# Patient Record
Sex: Female | Born: 1970 | Race: White | Hispanic: No | Marital: Married | Smoking: Never smoker
Health system: Southern US, Community
[De-identification: ages and names within clinical notes are randomized; demographics above are authoritative.]

## PROBLEM LIST (undated history)

## (undated) DIAGNOSIS — D649 Anemia, unspecified: Secondary | ICD-10-CM

## (undated) DIAGNOSIS — N289 Disorder of kidney and ureter, unspecified: Secondary | ICD-10-CM

## (undated) DIAGNOSIS — K219 Gastro-esophageal reflux disease without esophagitis: Secondary | ICD-10-CM

## (undated) DIAGNOSIS — J45909 Unspecified asthma, uncomplicated: Secondary | ICD-10-CM

## (undated) DIAGNOSIS — F419 Anxiety disorder, unspecified: Secondary | ICD-10-CM

## (undated) DIAGNOSIS — C541 Malignant neoplasm of endometrium: Secondary | ICD-10-CM

## (undated) DIAGNOSIS — J189 Pneumonia, unspecified organism: Secondary | ICD-10-CM

## (undated) DIAGNOSIS — Z87442 Personal history of urinary calculi: Secondary | ICD-10-CM

## (undated) HISTORY — PX: COLONOSCOPY: SHX174

## (undated) HISTORY — PX: CHOLECYSTECTOMY: SHX55

---

## 2005-09-14 ENCOUNTER — Emergency Department: Payer: Self-pay | Admitting: General Practice

## 2007-01-03 ENCOUNTER — Ambulatory Visit: Payer: Self-pay | Admitting: Internal Medicine

## 2009-10-14 ENCOUNTER — Ambulatory Visit: Payer: Self-pay | Admitting: Internal Medicine

## 2010-01-23 ENCOUNTER — Emergency Department: Payer: Self-pay | Admitting: Emergency Medicine

## 2010-07-29 ENCOUNTER — Ambulatory Visit: Payer: Self-pay | Admitting: Internal Medicine

## 2010-08-10 ENCOUNTER — Ambulatory Visit: Payer: Self-pay | Admitting: Family Medicine

## 2011-01-05 ENCOUNTER — Ambulatory Visit: Payer: Self-pay | Admitting: Family Medicine

## 2011-04-02 ENCOUNTER — Ambulatory Visit: Payer: Self-pay | Admitting: Internal Medicine

## 2012-10-17 ENCOUNTER — Emergency Department: Payer: Self-pay | Admitting: Emergency Medicine

## 2013-05-14 ENCOUNTER — Ambulatory Visit: Payer: Self-pay | Admitting: Family Medicine

## 2013-05-16 ENCOUNTER — Ambulatory Visit: Payer: Self-pay | Admitting: Emergency Medicine

## 2013-11-20 ENCOUNTER — Ambulatory Visit: Payer: Self-pay | Admitting: Internal Medicine

## 2013-11-20 LAB — URINALYSIS, COMPLETE
Bilirubin,UR: NEGATIVE
GLUCOSE, UR: NEGATIVE
KETONE: NEGATIVE
LEUKOCYTE ESTERASE: NEGATIVE
Nitrite: NEGATIVE
Ph: 6 (ref 5.0–8.0)
Specific Gravity: >=1.03 (ref 1.000–1.030)

## 2013-11-20 LAB — CBC WITH DIFFERENTIAL/PLATELET
BASOS ABS: 0 10*3/uL (ref 0.0–0.1)
Basophil %: 0.7 %
EOS ABS: 0.1 10*3/uL (ref 0.0–0.7)
Eosinophil %: 2.4 %
HCT: 41 % (ref 35.0–47.0)
HGB: 13.5 g/dL (ref 12.0–16.0)
LYMPHS PCT: 21.6 %
Lymphocyte #: 1.1 10*3/uL (ref 1.0–3.6)
MCH: 31.9 pg (ref 26.0–34.0)
MCHC: 32.9 g/dL (ref 32.0–36.0)
MCV: 97 fL (ref 80–100)
Monocyte #: 0.5 x10 3/mm (ref 0.2–0.9)
Monocyte %: 10.1 %
Neutrophil #: 3.4 10*3/uL (ref 1.4–6.5)
Neutrophil %: 65.2 %
Platelet: 194 10*3/uL (ref 150–440)
RBC: 4.23 10*6/uL (ref 3.80–5.20)
RDW: 13.2 % (ref 11.5–14.5)
WBC: 5.3 10*3/uL (ref 3.6–11.0)

## 2013-11-20 LAB — COMPREHENSIVE METABOLIC PANEL
ANION GAP: 7 (ref 7–16)
AST: 22 U/L (ref 15–37)
Albumin: 3.7 g/dL (ref 3.4–5.0)
Alkaline Phosphatase: 92 U/L
BILIRUBIN TOTAL: 0.5 mg/dL (ref 0.2–1.0)
BUN: 11 mg/dL (ref 7–18)
CALCIUM: 9.2 mg/dL (ref 8.5–10.1)
CO2: 31 mmol/L (ref 21–32)
Chloride: 102 mmol/L (ref 98–107)
Creatinine: 0.78 mg/dL (ref 0.60–1.30)
EGFR (African American): >60
GLUCOSE: 94 mg/dL (ref 65–99)
OSMOLALITY: 279 (ref 275–301)
Potassium: 3.9 mmol/L (ref 3.5–5.1)
SGPT (ALT): 24 U/L
Sodium: 140 mmol/L (ref 136–145)
Total Protein: 7.7 g/dL (ref 6.4–8.2)

## 2013-11-20 LAB — AMYLASE: Amylase: 38 U/L (ref 25–115)

## 2013-11-20 LAB — WET PREP, GENITAL

## 2013-11-20 LAB — LIPASE, BLOOD: LIPASE: 105 U/L (ref 73–393)

## 2013-11-20 LAB — GC/CHLAMYDIA PROBE AMP

## 2013-11-22 LAB — URINE CULTURE

## 2014-06-10 ENCOUNTER — Ambulatory Visit: Payer: Self-pay | Admitting: Emergency Medicine

## 2014-06-14 ENCOUNTER — Ambulatory Visit: Payer: Self-pay | Admitting: Registered Nurse

## 2014-07-02 ENCOUNTER — Ambulatory Visit: Payer: Self-pay | Admitting: Family Medicine

## 2014-07-23 ENCOUNTER — Ambulatory Visit: Admit: 2014-07-23 | Disposition: A | Discharge status: Home / Self Care | Payer: Self-pay | Admitting: Family Medicine

## 2015-07-15 ENCOUNTER — Encounter: Payer: Self-pay | Admitting: *Deleted

## 2015-07-15 ENCOUNTER — Ambulatory Visit
Admission: EM | Admit: 2015-07-15 | Discharge: 2015-07-15 | Disposition: A | Discharge status: Home / Self Care | Payer: BLUE CROSS/BLUE SHIELD | Attending: Family Medicine | Admitting: Family Medicine

## 2015-07-15 DIAGNOSIS — N390 Urinary tract infection, site not specified: Secondary | ICD-10-CM | POA: Diagnosis not present

## 2015-07-15 HISTORY — DX: Disorder of kidney and ureter, unspecified: N28.9

## 2015-07-15 LAB — URINALYSIS COMPLETE WITH MICROSCOPIC (ARMC ONLY)
Bilirubin Urine: NEGATIVE
Glucose, UA: NEGATIVE mg/dL
Ketones, ur: NEGATIVE mg/dL
NITRITE: NEGATIVE
PROTEIN: NEGATIVE mg/dL
SPECIFIC GRAVITY, URINE: 1.01 (ref 1.005–1.030)
pH: 5.5 (ref 5.0–8.0)

## 2015-07-15 LAB — PREGNANCY, URINE: PREG TEST UR: NEGATIVE

## 2015-07-15 MED ORDER — NITROFURANTOIN MONOHYD MACRO 100 MG PO CAPS: 100.0000 mg | ORAL_CAPSULE | Freq: Two times a day (BID) | ORAL | Status: DC

## 2015-07-15 NOTE — ED Notes (Signed)
Patient started having symptoms of urinary frequency and back pain 2 days ago.

## 2015-07-15 NOTE — ED Provider Notes (Signed)
CSN: [id number]     Arrival date & time 07/15/15  0802 History   First MD Initiated Contact with Patient 07/15/15 [phone]     Chief Complaint  Patient presents with  . Urinary Tract Infection   (Consider location/radiation/quality/duration/timing/severity/associated sxs/prior Treatment) HPI: Patient presents today with symptoms of urinary frequency, suprapubic pain, bilateral flank pain. Patient states that she's had the symptoms for the last 2 days. She denies any fever, vomiting, diarrhea, vaginal discharge, vaginal bleeding. She states that her menstrual cycles are irregular and her last menstrual cycle was in January. ?renal disorder listed on PMHx. She states renal function is normal.   Past Medical History  Diagnosis Date  . Renal disorder    Past Surgical History  Procedure Laterality Date  . Cholecystectomy     History reviewed. No pertinent family history. Social History  Substance Use Topics  . Smoking status: Never Smoker   . Smokeless tobacco: Never Used  . Alcohol Use: No   OB History    No data available     Review of Systems: Negative except mentioned above.   Allergies  Keflex  Home Medications   Prior to Admission medications   Medication Sig Start Date End Date Taking? Authorizing Provider  nitrofurantoin, macrocrystal-monohydrate, (MACROBID) 100 MG capsule Take 1 capsule (100 mg total) by mouth 2 (two) times daily. 07/15/15   Paulina Fusi, MD   Meds Ordered and Administered this Visit  Medications - No data to display  BP 118/50 mmHg  Pulse 65  Resp 18  Ht 5\' 8"  (1.727 m)  Wt 300 lb (136.079 kg)  BMI 45.63 kg/m2  SpO2 97%  LMP 05/12/2015 No data found.   Physical Exam   GENERAL: NAD, obese HEENT: no pharyngeal erythema, no exudate RESP: CTA B CARD: RRR ABD: +BS, mild suprapubic tenderness, mild bilateral flank tenderness  NEURO: CN II-XII grossly intact   ED Course  Procedures (including critical care time)  Labs Review Labs Reviewed   URINALYSIS COMPLETEWITH MICROSCOPIC ([place]) - Abnormal; Notable for the following:    Color, Urine YELLOW (*)    APPearance HAZY (*)    Hgb urine dipstick 1+ (*)    Leukocytes, UA 3+ (*)    Bacteria, UA FEW (*)    Squamous Epithelial / LPF 6-30 (*)    All other components within normal limits  URINE CULTURE  PREGNANCY, URINE    Imaging Review No results found.   MDM   1. Urinary tract infection without hematuria, site unspecified   Urine analysis reviewed, urine sent for culture, urine pregnancy negative, will treat patient with Macrobid, seek medical attention if symptoms persist or worsen as discussed.    Paulina Fusi, MD 07/15/15 [phone]

## 2015-07-16 LAB — URINE CULTURE

## 2016-01-20 ENCOUNTER — Ambulatory Visit (INDEPENDENT_AMBULATORY_CARE_PROVIDER_SITE_OTHER)
Admission: EM | Admit: 2016-01-20 | Discharge: 2016-01-20 | Disposition: A | Discharge status: Home / Self Care | Payer: BLUE CROSS/BLUE SHIELD | Source: Home / Self Care | Attending: Family Medicine | Admitting: Family Medicine

## 2016-01-20 ENCOUNTER — Encounter: Payer: Self-pay | Admitting: Emergency Medicine

## 2016-01-20 ENCOUNTER — Emergency Department
Admission: EM | Admit: 2016-01-20 | Discharge: 2016-01-20 | Disposition: A | Discharge status: Home / Self Care | Payer: BLUE CROSS/BLUE SHIELD | Attending: Emergency Medicine | Admitting: Emergency Medicine

## 2016-01-20 ENCOUNTER — Emergency Department: Payer: BLUE CROSS/BLUE SHIELD

## 2016-01-20 DIAGNOSIS — R079 Chest pain, unspecified: Secondary | ICD-10-CM

## 2016-01-20 DIAGNOSIS — Z79899 Other long term (current) drug therapy: Secondary | ICD-10-CM | POA: Insufficient documentation

## 2016-01-20 LAB — CBC
HCT: 38.9 % (ref 35.0–47.0)
HEMOGLOBIN: 13.3 g/dL (ref 12.0–16.0)
MCH: 31.9 pg (ref 26.0–34.0)
MCHC: 34.3 g/dL (ref 32.0–36.0)
MCV: 93 fL (ref 80.0–100.0)
PLATELETS: 187 10*3/uL (ref 150–440)
RBC: 4.18 MIL/uL (ref 3.80–5.20)
RDW: 14.3 % (ref 11.5–14.5)
WBC: 6.2 10*3/uL (ref 3.6–11.0)

## 2016-01-20 LAB — BASIC METABOLIC PANEL
ANION GAP: 6 (ref 5–15)
BUN: 10 mg/dL (ref 6–20)
CHLORIDE: 105 mmol/L (ref 101–111)
CO2: 28 mmol/L (ref 22–32)
CREATININE: 0.83 mg/dL (ref 0.44–1.00)
Calcium: 9.1 mg/dL (ref 8.9–10.3)
GFR calc non Af Amer: >60 mL/min (ref >60)
Glucose, Bld: 103 mg/dL — ABNORMAL HIGH (ref 65–99)
Potassium: 3.7 mmol/L (ref 3.5–5.1)
Sodium: 139 mmol/L (ref 135–145)

## 2016-01-20 LAB — TROPONIN I

## 2016-01-20 MED ORDER — ASPIRIN 81 MG PO CHEW
324.0000 mg | CHEWABLE_TABLET | Freq: Once | ORAL | Status: AC
  Administered 2016-01-20: 324 mg via ORAL

## 2016-01-20 NOTE — ED Triage Notes (Addendum)
Patient c/o chest pain. She has a history acid reflux and takes zantac over the counter, but a family history of heart attacks on both sides of the family. She mentions the pain started last night when she was feeling really stressful at work. She says the pain in her chest and left shoulder it feels like she pulled a muscle in her shoulder.

## 2016-01-20 NOTE — ED Provider Notes (Signed)
[facility] ____________________________________________  Time seen: Approximately 2:36 PM  I have reviewed the triage vital signs and the nursing notes.   HISTORY  Chief Complaint Chest Pain   HPI Teresa Cameron is a 45 y.o. female presenting today for the complaints of left-sided chest pain. Patient with a history of obesity and gastric reflux, he reports that last night at around 2 or 3 AM while at work she began having left-sided chest pain. Patient reports this onset was during a verbal argument. Patient reports her work has been very stressful for the last several months. Patient reports left-sided chest pain has continued. Reports some intermittent radiation to the left posterior shoulder blade and left arm. Reports pain has been constant but does have fluctuating severity. States pain at this time is mild and described as just a pain to left chest.  Patient does report she has a history of gastric reflux issues with some previous similar symptoms, but states this is atypical due to the radiation to back and arm.  Denies fall or trauma. Denies strenuous activity. Denies being able to reproduce the pain. Denies accompanying dizziness, shortness of breath, vision changes, numbness or loss of sensation, weakness. Reports last ate approximately 9 AM. Denies smoking history. Patient denies any other chronic medical conditions. Reports extensive cardiac history on both mother and father side.    Past Medical History:  Diagnosis Date  . Renal disorder     There are no active problems to display for this patient.   Past Surgical History:  Procedure Laterality Date  . CHOLECYSTECTOMY      Current Outpatient Rx  . Order #: [id number] Class: Historical Med    No current facility-administered medications for this encounter.   Current Outpatient Prescriptions:  .  ranitidine (ZANTAC) 150 MG tablet, Take 150 mg by mouth 2 (two) times daily., Disp: , Rfl:    Allergies Keflex [cephalexin]  Family History  Problem Relation Age of Onset  . Heart failure Mother   . Heart failure Father   Grandfather MI (deceased in 99s) Uncle MI DM  Social History Social History  Substance Use Topics  . Smoking status: Never Smoker  . Smokeless tobacco: Never Used  . Alcohol use No    Review of Systems Constitutional: No fever/chills Eyes: No visual changes. ENT: No sore throat. Cardiovascular: Positive chest pain. Respiratory: Denies shortness of breath. Gastrointestinal: No abdominal pain.  No nausea, no vomiting.  No diarrhea.  No constipation. Genitourinary: Negative for dysuria. Musculoskeletal: Negative for back pain. Skin: Negative for rash. Neurological: Negative for headaches, focal weakness or numbness.  10-point ROS otherwise negative.  ____________________________________________   PHYSICAL EXAM:  VITAL SIGNS: ED Triage Vitals  Enc Vitals Group     BP 01/20/16 1346 (!) 155/82     Pulse Rate 01/20/16 1346 82     Resp 01/20/16 1346 18     Temp 01/20/16 1346 97.9 F (36.6 C)     Temp Source 01/20/16 1346 Oral     SpO2 01/20/16 1346 97 %     Weight 01/20/16 1346 300 lb (136.1 kg)     Height 01/20/16 1346 5\' 8"  (1.727 m)     Head Circumference --      Peak Flow --      Pain Score 01/20/16 1347 3     Pain Loc --      Pain Edu? --      Excl. in [place]? --     Constitutional: Alert and oriented.  Well appearing and in no acute distress. Eyes: Conjunctivae are normal. PERRL. EOMI. ENT      Head: Normocephalic and atraumatic.      Nose: No congestion/rhinnorhea.      Mouth/Throat: Mucous membranes are moist.Oropharynx non-erythematous. Cardiovascular: Normal rate, regular rhythm. Grossly normal heart sounds.  Good peripheral circulation. Respiratory: Normal respiratory effort without tachypnea nor retractions. Breath sounds are clear and equal bilaterally. No wheezes/rales/rhonchi.. Gastrointestinal: Soft and nontender. Obese  abdomen. Musculoskeletal:  Nontender with normal range of motion in all extremities. No midline cervical, thoracic or lumbar tenderness to palpation. Bilateral pedal pulses equal and easily palpated. left anterior chest minimal diffuse tenderness to direct palpation, no pain with left arm range of motion or movement. Neurologic:  Normal speech and language. No gross focal neurologic deficits are appreciated. Speech is normal. No gait instability.  Skin:  Skin is warm, dry and intact. No rash noted. Psychiatric: Mood and affect are normal. Speech and behavior are normal. Patient exhibits appropriate insight and judgment   ___________________________________________   LABS (all labs ordered are listed, but only abnormal results are displayed)  Labs Reviewed - No data to display ____________________________________________  EKG  ED ECG REPORT I, Marylene Land, the attending provider and Dr. Zenda Alpers, personally viewed and interpreted this ECG.   Date: 01/20/2016  EKG Time: 1346   Rate: 73  Rhythm: sinus rhythm with marked sinus arrhythmia  Axis:normal  Intervals:none  ST&T Change: nonspecific T wave abnormality.    PROCEDURES Procedures     INITIAL IMPRESSION / ASSESSMENT AND PLAN / ED COURSE  Pertinent labs & imaging results that were available during my care of the patient were reviewed by me and considered in my medical decision making (see chart for details).  Overall well-appearing patient. No acute distress. Presents for 12 hour complaint of left-sided chest pain. Reports current chest pain present and described as mild. Discussed in detail with patient concern for cardiac etiology. Discussed pain is somewhat reproducible to left anterior chest and with some previous description of similar sensations with acid reflux, however discussed possibility of cardiac etiology. Patient with obesity and family history of cardiac issues. Recommend for patient to be seen in emergency room  at this time for further evaluation. Patient alert and oriented with decisional capacity, and states that her brother will take her directly to the ER, declines EMS transfer. 324 mg chewable aspirin given in urgent care. Marya Amsler RN charge at [facility] called and report given. Patient stable the time of transfer to ER.  Discussed follow up with Primary care physician this week. Discussed follow up and return parameters including no resolution or any worsening concerns. Patient verbalized understanding and agreed to plan.   ____________________________________________   FINAL CLINICAL IMPRESSION(S) / ED DIAGNOSES  Final diagnoses:  Chest pain, unspecified type     Discharge Medication List as of 01/20/2016  2:17 PM      Note: This dictation was prepared with Dragon dictation along with smaller phrase technology. Any transcriptional errors that result from this process are unintentional.    Clinical Course      Marylene Land, NP 01/20/16 1458    Marylene Land, NP 01/20/16 1459

## 2016-01-20 NOTE — Discharge Instructions (Signed)
Go directly to Emergency room as discussed.  °

## 2016-01-20 NOTE — ED Notes (Signed)
Pt c/o left sided chest pain that started last night while she was at work.  Pt reports pain radiates into left shoulder/arm.  Pt denies sob, denies dizziness, denies back pain.

## 2016-01-20 NOTE — ED Triage Notes (Signed)
Pt to ed with c/o left sided chest pain that started last night while she was at work.  Pt reports pain radiates into left shoulder, left arm area.  Pt denies sob, denies dizziness, denies back pain, reports "I feel stressed"

## 2016-01-20 NOTE — ED Provider Notes (Signed)
[facility] Emergency Department Provider Note  ____________________________________________   First MD Initiated Contact with Patient 01/20/16 1632     (approximate)  I have reviewed the triage vital signs and the nursing notes.   HISTORY  Chief Complaint Chest Pain   HPI Teresa Cameron is a 45 y.o. female with a history of asthma and gastroesophageal reflux was presenting with chest pain that started at 2 AM last night. She says that she was in a confrontation at work as a Presenter, broadcasting and she began having 2-3 out of 10 left-sided chest pain which radiated to her left shoulder. She denies any shortness of breath, nausea vomiting or diaphoresis with this. She says that she has had similar pain in the past in stressful situations but says that it has not lasted this long. She says that she took a Zantac last night which helped the pain for several hours. However, she feels the pain has been coming and going and lasts for 1-2 hours at a time. She is a family history of heart disease with her grandfather dying in his 88s from heart disease. She denies smoking or drug use. Describes the pain as a tingling and burning that rises up through her chest and radiates leftward. She denies any pain at this time.  Patient was seen in urgent care earlier today and then sent to the emergency department for further evaluation.   Past Medical History:  Diagnosis Date  . Renal disorder     There are no active problems to display for this patient.   Past Surgical History:  Procedure Laterality Date  . CHOLECYSTECTOMY      Prior to Admission medications   Medication Sig Start Date End Date Taking? Authorizing Provider  dicyclomine (BENTYL) 10 MG capsule Take 10 mg by mouth 4 (four) times daily -  before meals and at bedtime.   Yes Historical Provider, MD  ranitidine (ZANTAC) 150 MG tablet Take 150 mg by mouth 2 (two) times daily.   Yes Historical Provider, MD     Allergies Keflex [cephalexin]  Family History  Problem Relation Age of Onset  . Heart failure Mother   . Heart failure Father     Social History Social History  Substance Use Topics  . Smoking status: Never Smoker  . Smokeless tobacco: Never Used  . Alcohol use No    Review of Systems Constitutional: No fever/chills Eyes: No visual changes. ENT: No sore throat. Cardiovascular: as above Respiratory: Denies shortness of breath. Gastrointestinal: No abdominal pain.  No nausea, no vomiting.  No diarrhea.  No constipation. Genitourinary: Negative for dysuria. Musculoskeletal: Negative for back pain. Skin: Negative for rash. Neurological: Negative for headaches, focal weakness or numbness.  10-point ROS otherwise negative.  ____________________________________________   PHYSICAL EXAM:  VITAL SIGNS: ED Triage Vitals [01/20/16 1448]  Enc Vitals Group     BP      Pulse      Resp      Temp      Temp src      SpO2      Weight 300 lb (136.1 kg)     Height      Head Circumference      Peak Flow      Pain Score 3     Pain Loc      Pain Edu?      Excl. in [place]?     Constitutional: Alert and oriented. Well appearing and in no acute distress.  Eyes: Conjunctivae are normal. PERRL. EOMI. Head: Atraumatic. Nose: No congestion/rhinnorhea. Mouth/Throat: Mucous membranes are moist.  Neck: No stridor.   Cardiovascular: Normal rate, regular rhythm. Grossly normal heart sounds.  Chest pain is not reproducible to palpation. Respiratory: Normal respiratory effort.  No retractions. Lungs CTAB. Gastrointestinal: Soft and nontender. No distention. Musculoskeletal: No lower extremity tenderness nor edema.  No joint effusions. Neurologic:  Normal speech and language. No gross focal neurologic deficits are appreciated Skin:  Skin is warm, dry and intact. No rash noted. Psychiatric: Mood and affect are normal. Speech and behavior are  normal.  ____________________________________________   LABS (all labs ordered are listed, but only abnormal results are displayed)  Labs Reviewed  BASIC METABOLIC PANEL - Abnormal; Notable for the following:       Result Value   Glucose, Bld 103 (*)    All other components within normal limits  CBC  TROPONIN I   ____________________________________________  EKG  ED ECG REPORT I, Doran Stabler, the attending physician, personally viewed and interpreted this ECG.   Date: 01/20/2016  EKG Time: 1453  Rate: 66  Rhythm: normal sinus rhythm with sinus arrhythmia.  Axis: Normal  Intervals:none  ST&T Change: No ST elevation or depression. T-wave inversions in 3, aVF as well as V3 through V6. No previous for comparison's.  ____________________________________________  RADIOLOGY  DG Chest 2 View (Accession [id number]) (Order [id number])  Imaging  Date: 01/20/2016 Department: [place] Released By: Dan Europe, RN (auto-released) Authorizing: Orbie Pyo, MD  Exam Information   Status Exam Begun  Exam Ended   Final [99] 01/20/2016 3:06 PM 01/20/2016 3:07 PM  PACS Images   Show images for DG Chest 2 View  Study Result   CLINICAL DATA:  Left-sided chest pain  EXAM: CHEST  2 VIEW  COMPARISON:  Chest CT July 02, 2014; chest radiograph June 14, 2014  FINDINGS: There is no edema or consolidation. Heart size and pulmonary vascularity are within normal limits. No adenopathy. No pneumothorax. There is a prominent osteophyte in the posterior mid thoracic region, stable from prior studies.  IMPRESSION: No edema or consolidation. Stable prominent osteophyte posteriorly in the mid thoracic region.   Electronically Signed   By: Lowella Grip III M.D.   On: 01/20/2016 15:11     ____________________________________________   PROCEDURES  Procedure(s) performed:   Procedures  Critical Care  performed:   ____________________________________________   INITIAL IMPRESSION / ASSESSMENT AND PLAN / ED COURSE  Pertinent labs & imaging results that were available during my care of the patient were reviewed by me and considered in my medical decision making (see chart for details).  Patient symptom-free at this time. I discussed with her the changes on her EKG as well as offered further workup including another troponin in the emergency department. However, she is a heart score of 2 and she says that she would rather be seen as an outpatient. She is symptom-free at this time and has had multiple episodes of 1-2 hours of symptoms and still has an undetectable troponin level. She may have these T-wave changes from LVH and not ischemic disease. I discussed the need for her to urgently follow up with cardiology either tomorrow or Friday. She'll be given the office number for the cardiologist on-call and nose to return to the emergency room immediately for any worsening or concerning symptoms. She is understanding of this plan and willing to comply. While this episode is slightly different than  previous episodes the patient has had with stress she does say there are a lot of commonality's. It is quite possible this could be a combination of stress and reflux causing the symptoms and not cardiac disease. Normal heart rate. No shortness of breath. Unlikely to be pulmonary embolus.  Clinical Course     ____________________________________________   FINAL CLINICAL IMPRESSION(S) / ED DIAGNOSES  Chest pain.    NEW MEDICATIONS STARTED DURING THIS VISIT:  New Prescriptions   No medications on file     Note:  This document was prepared using Dragon voice recognition software and may include unintentional dictation errors.    Orbie Pyo, MD 01/20/16 [phone]

## 2016-10-12 ENCOUNTER — Ambulatory Visit
Admission: EM | Admit: 2016-10-12 | Discharge: 2016-10-12 | Disposition: A | Discharge status: Home / Self Care | Payer: BLUE CROSS/BLUE SHIELD | Attending: Family Medicine | Admitting: Family Medicine

## 2016-10-12 DIAGNOSIS — J069 Acute upper respiratory infection, unspecified: Secondary | ICD-10-CM

## 2016-10-12 HISTORY — DX: Unspecified asthma, uncomplicated: J45.909

## 2016-10-12 MED ORDER — BENZONATATE 200 MG PO CAPS: 200.0000 mg | ORAL_CAPSULE | Freq: Three times a day (TID) | ORAL | 0 refills | Status: DC

## 2016-10-12 MED ORDER — HYDROCOD POLST-CPM POLST ER 10-8 MG/5ML PO SUER: 5.0000 mL | Freq: Two times a day (BID) | ORAL | 0 refills | Status: DC

## 2016-10-12 MED ORDER — FEXOFENADINE-PSEUDOEPHED ER 60-120 MG PO TB12: 1.0000 | ORAL_TABLET | Freq: Two times a day (BID) | ORAL | 0 refills | Status: DC

## 2016-10-12 MED ORDER — FLUTICASONE PROPIONATE 50 MCG/ACT NA SUSP: 2.0000 | Freq: Every day | NASAL | 0 refills | Status: DC

## 2016-10-12 NOTE — ED Provider Notes (Signed)
CSN: [id number]     Arrival date & time 10/12/16  0805 History   First MD Initiated Contact with Patient 10/12/16 0825     Chief Complaint  Patient presents with  . Cough   (Consider location/radiation/quality/duration/timing/severity/associated sxs/prior Treatment) HPI 46 year old female who presents with cough and sinus pressure body aches nausea started yesterday. He states that the family member has had a diagnosis of bronchitis recently. Her cough is nonproductive. She has not had any fever or chills. States that her ears are itchy and her throat is slightly scratchy. He has been using only Tylenol for relief of her symptoms.        Past Medical History:  Diagnosis Date  . Asthma   . Renal disorder    Past Surgical History:  Procedure Laterality Date  . CHOLECYSTECTOMY     Family History  Problem Relation Age of Onset  . Heart failure Mother   . Heart failure Father    Social History  Substance Use Topics  . Smoking status: Never Smoker  . Smokeless tobacco: Never Used  . Alcohol use No   OB History    No data available     Review of Systems  Constitutional: Negative for chills, fatigue and fever.  HENT: Positive for congestion, ear pain, postnasal drip, sinus pain, sinus pressure and sore throat.   Respiratory: Positive for cough.   All other systems reviewed and are negative.   Allergies  Keflex [cephalexin]  Home Medications   Prior to Admission medications   Medication Sig Start Date End Date Taking? Authorizing Provider  benzonatate (TESSALON) 200 MG capsule Take 1 capsule (200 mg total) by mouth 3 (three) times daily. 10/12/16   Lorin Picket, PA-C  chlorpheniramine-HYDROcodone (TUSSIONEX PENNKINETIC ER) 10-8 MG/5ML SUER Take 5 mLs by mouth 2 (two) times daily. 10/12/16   Lorin Picket, PA-C  fexofenadine-pseudoephedrine (ALLEGRA-D) 60-120 MG 12 hr tablet Take 1 tablet by mouth every 12 (twelve) hours. 10/12/16   Lorin Picket, PA-C   fluticasone (FLONASE) 50 MCG/ACT nasal spray Place 2 sprays into both nostrils daily. 10/12/16   Lorin Picket, PA-C   Meds Ordered and Administered this Visit  Medications - No data to display  BP 137/69 (BP Location: Left Arm)   Pulse 72   Temp 98.2 F (36.8 C) (Oral)   Resp 18   Ht 5\' 8"  (1.727 m)   Wt (!) 330 lb (149.7 kg)   SpO2 97%   BMI 50.18 kg/m  No data found.   Physical Exam  Constitutional: She is oriented to person, place, and time. She appears well-developed and well-nourished. No distress.  HENT:  Head: Normocephalic and atraumatic.  Right Ear: External ear normal.  Left Ear: External ear normal.  Nose: Nose normal.  Mouth/Throat: Oropharynx is clear and moist. No oropharyngeal exudate.  Eyes: EOM are normal. Pupils are equal, round, and reactive to light. Right eye exhibits no discharge. Left eye exhibits no discharge.  Neck: Normal range of motion.  Pulmonary/Chest: Effort normal and breath sounds normal. No respiratory distress. She has no wheezes. She has no rales.  Musculoskeletal: Normal range of motion.  Lymphadenopathy:    She has no cervical adenopathy.  Neurological: She is alert and oriented to person, place, and time.  Skin: Skin is warm and dry. She is not diaphoretic.  Psychiatric: She has a normal mood and affect. Her behavior is normal. Judgment and thought content normal.  Nursing note and vitals reviewed.   Urgent  Care Course     Procedures (including critical care time)  Labs Review Labs Reviewed - No data to display  Imaging Review No results found.   Visual Acuity Review  Right Eye Distance:   Left Eye Distance:   Bilateral Distance:    Right Eye Near:   Left Eye Near:    Bilateral Near:         MDM   1. Upper respiratory tract infection, unspecified type    Discharge Medication List as of 10/12/2016  8:40 AM    START taking these medications   Details  benzonatate (TESSALON) 200 MG capsule Take 1 capsule  (200 mg total) by mouth 3 (three) times daily., Starting Wed 10/12/2016, Normal    chlorpheniramine-HYDROcodone (TUSSIONEX PENNKINETIC ER) 10-8 MG/5ML SUER Take 5 mLs by mouth 2 (two) times daily., Starting Wed 10/12/2016, Print    fexofenadine-pseudoephedrine (ALLEGRA-D) 60-120 MG 12 hr tablet Take 1 tablet by mouth every 12 (twelve) hours., Starting Wed 10/12/2016, Normal    fluticasone (FLONASE) 50 MCG/ACT nasal spray Place 2 sprays into both nostrils daily., Starting Wed 10/12/2016, Normal      Plan: 1. Test/x-ray results and diagnosis reviewed with patient 2. rx as per orders; risks, benefits, potential side effects reviewed with patient 3. Recommend supportive treatment with Medical treatment of the cough and body aches. I recommended that she consider using ibuprofen along with the Tylenol. I told her this is a viral illness and will need to run its course. If however she continues to have her symptoms or even worsens and that tend to 14 days she will return to the clinic for additional evaluation. 4. F/u prn if symptoms worsen or don't improve     Lorin Picket, PA-C 10/12/16 [id number]

## 2016-10-12 NOTE — ED Triage Notes (Addendum)
Pt c/o cough, sinus pressure, body aches, nausea and not feeling well since yesterday. She mentions that someone in her home has bronchitis and she feels that she may have it also.

## 2017-04-10 IMAGING — CR DG LUMBAR SPINE 2-3V
1 series · 4 of 4 positions shown · non-contrast
Comparison: None.

CLINICAL DATA: Chronic lumbago/low back pain. Back pain for several
years. RIGHT leg pain and numbness with tingling.

EXAM:
LUMBAR SPINE - 2-3 VIEW

[Series 1: ap · 0.17mm/px · 4 of 4 slices shown]
[im 1/4]
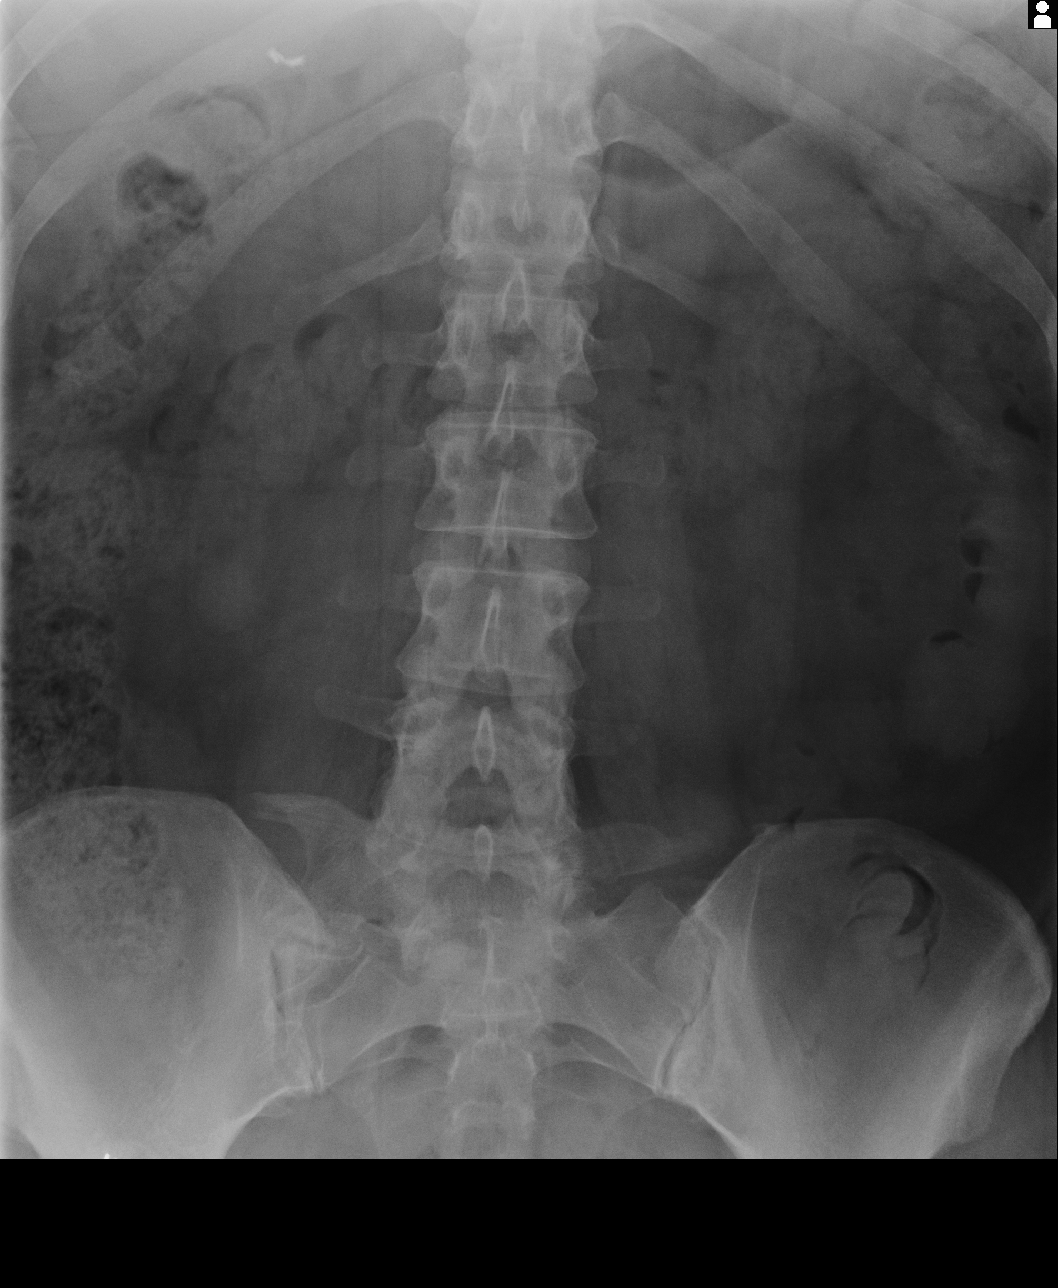
[im 2/4]
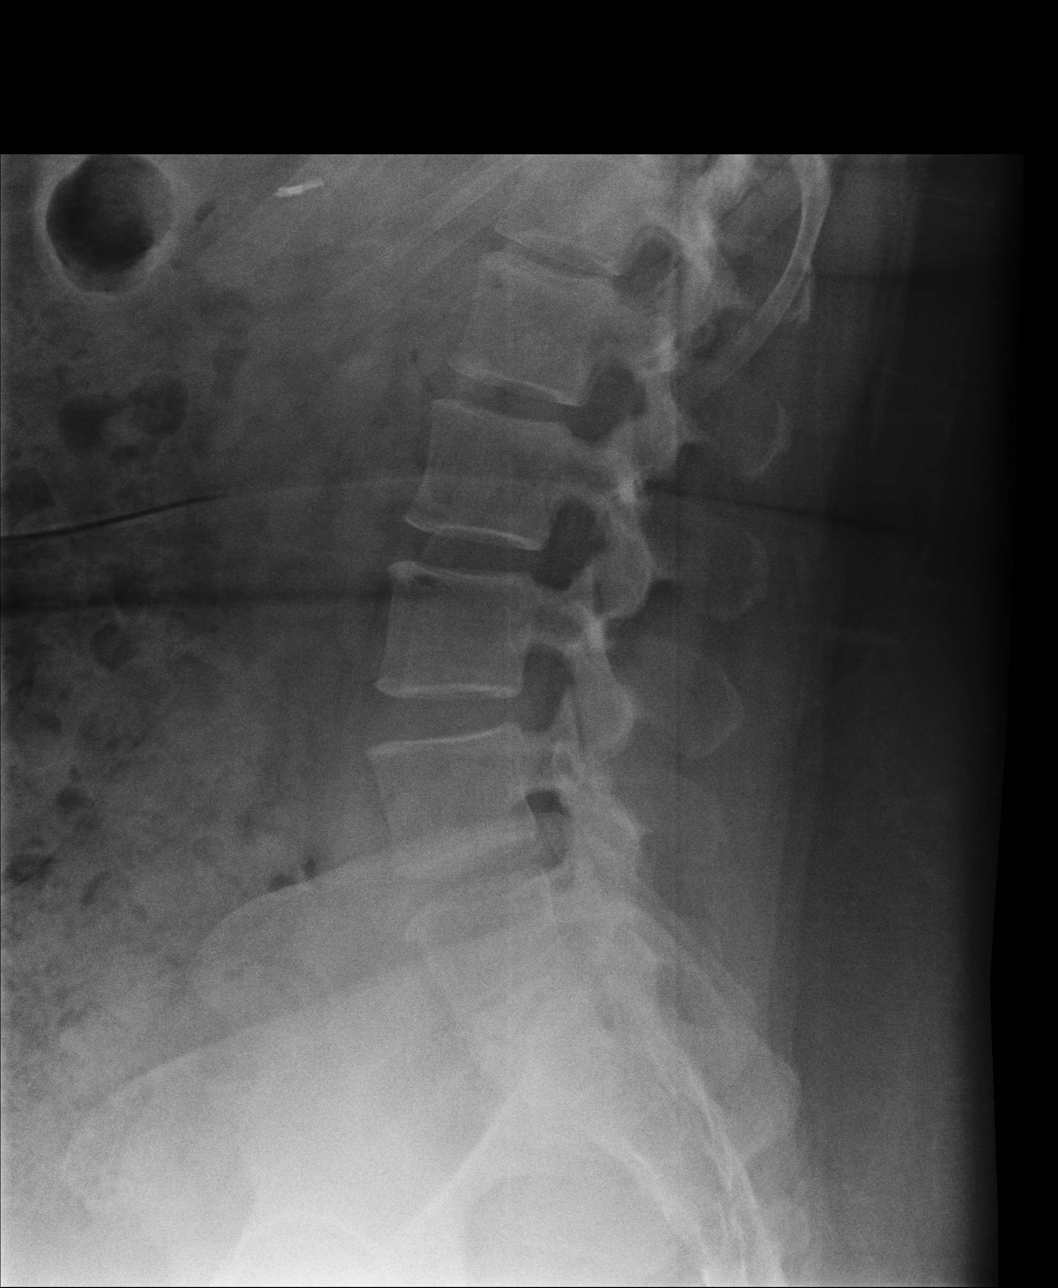
[im 3/4]
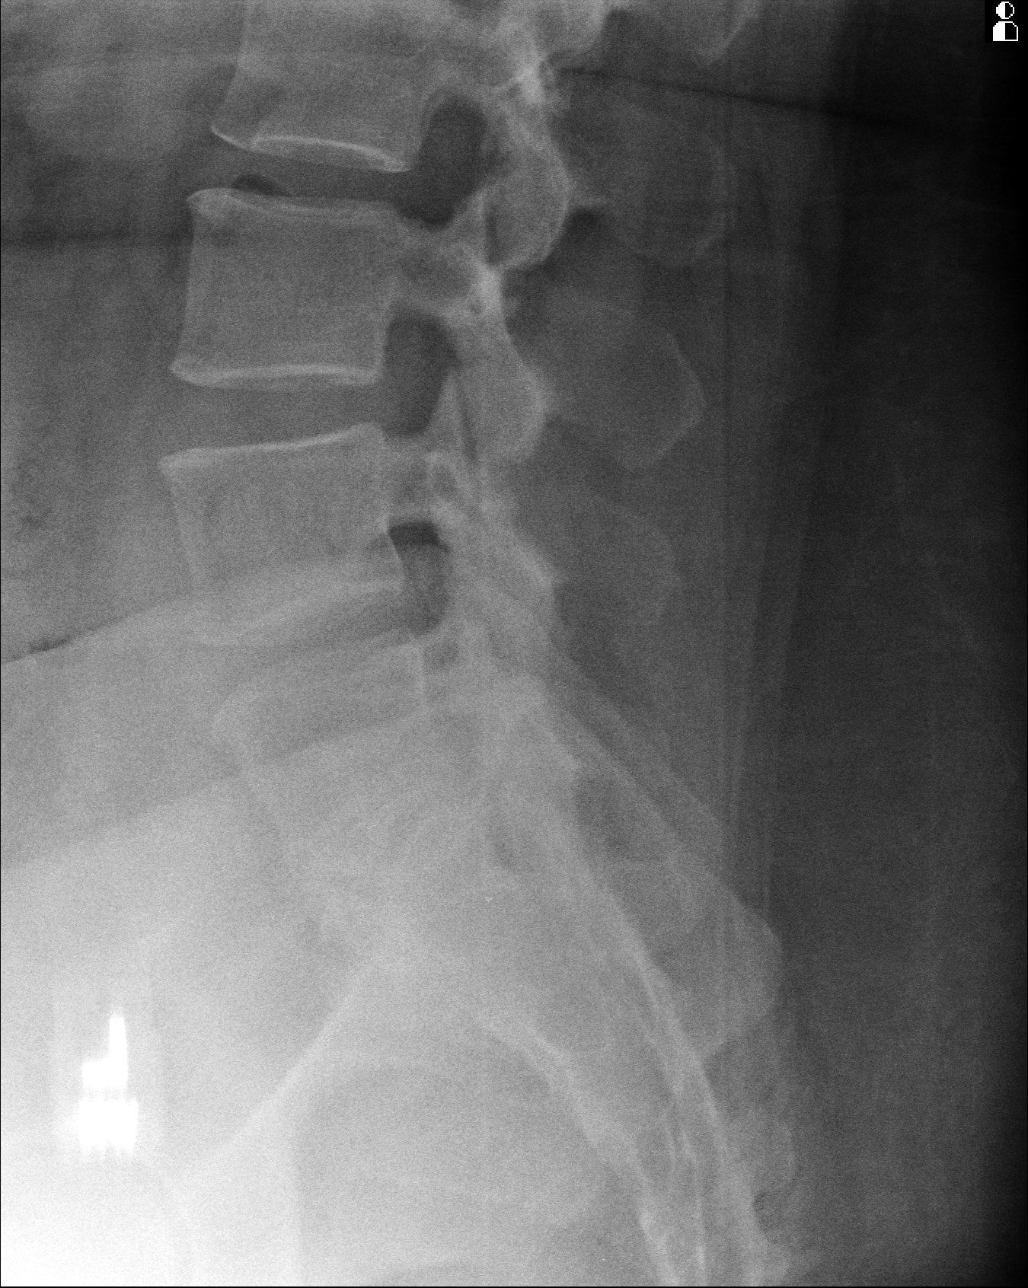
[im 4/4]
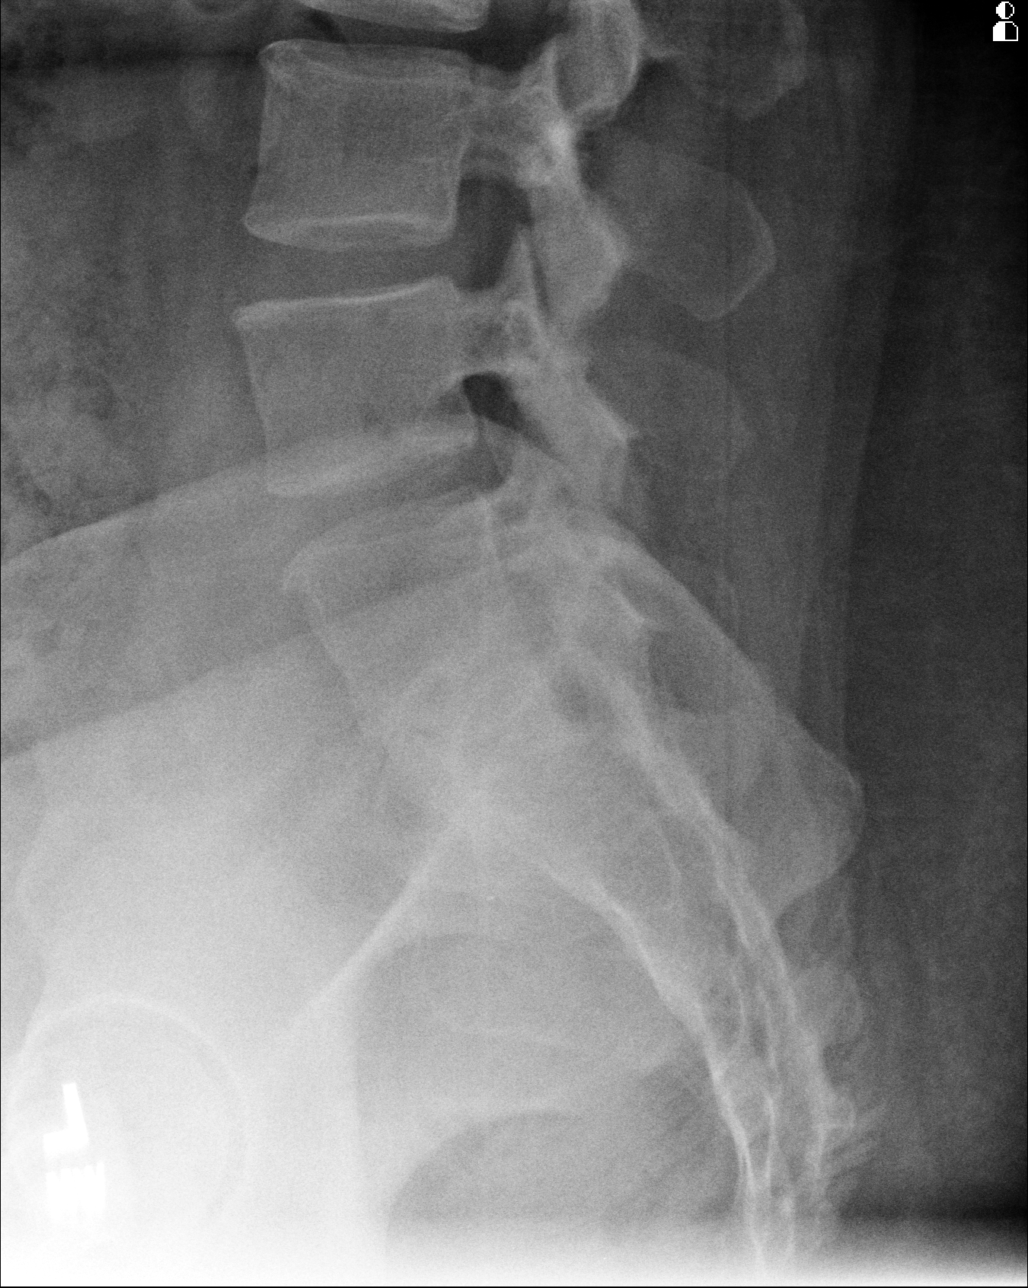

[4 of 4 positions shown; findings below may reference images not displayed]

FINDINGS: Five lumbar type vertebral bodies are present with sacralization of
the RIGHT L5 transverse process. Rudimentary L5-S1 disc noted on the
lateral view. The RIGHT L5 transverse process articulates with the
sacrum. Vertebral body height is preserved. Intervertebral disc
spaces appear within normal limits. Lumbosacral transitional anatomy
with sacralization of the RIGHT L5 transverse process.
IMPRESSION: No acute osseous abnormality.

## 2019-04-04 ENCOUNTER — Ambulatory Visit
Admission: EM | Admit: 2019-04-04 | Discharge: 2019-04-04 | Disposition: A | Discharge status: Home / Self Care | Payer: Managed Care, Other (non HMO) | Attending: Family Medicine | Admitting: Family Medicine

## 2019-04-04 ENCOUNTER — Other Ambulatory Visit: Payer: Self-pay

## 2019-04-04 DIAGNOSIS — M791 Myalgia, unspecified site: Secondary | ICD-10-CM | POA: Diagnosis not present

## 2019-04-04 DIAGNOSIS — Z20828 Contact with and (suspected) exposure to other viral communicable diseases: Secondary | ICD-10-CM | POA: Insufficient documentation

## 2019-04-04 DIAGNOSIS — R509 Fever, unspecified: Secondary | ICD-10-CM | POA: Diagnosis not present

## 2019-04-04 DIAGNOSIS — R110 Nausea: Secondary | ICD-10-CM | POA: Diagnosis not present

## 2019-04-04 DIAGNOSIS — Z881 Allergy status to other antibiotic agents status: Secondary | ICD-10-CM | POA: Insufficient documentation

## 2019-04-04 DIAGNOSIS — R109 Unspecified abdominal pain: Secondary | ICD-10-CM | POA: Diagnosis not present

## 2019-04-04 DIAGNOSIS — Z79899 Other long term (current) drug therapy: Secondary | ICD-10-CM | POA: Diagnosis not present

## 2019-04-04 LAB — URINALYSIS, COMPLETE (UACMP) WITH MICROSCOPIC
Bacteria, UA: NONE SEEN
Bilirubin Urine: NEGATIVE
Glucose, UA: NEGATIVE mg/dL
Ketones, ur: NEGATIVE mg/dL
Leukocytes,Ua: NEGATIVE
Nitrite: NEGATIVE
Protein, ur: NEGATIVE mg/dL
Specific Gravity, Urine: 1.015 (ref 1.005–1.030)
pH: 6 (ref 5.0–8.0)

## 2019-04-04 LAB — SARS CORONAVIRUS 2 AG (30 MIN TAT): SARS Coronavirus 2 Ag: NEGATIVE

## 2019-04-04 MED ORDER — ONDANSETRON HCL 4 MG PO TABS: 4.0000 mg | ORAL_TABLET | Freq: Three times a day (TID) | ORAL | 0 refills | Status: DC | PRN

## 2019-04-04 NOTE — Discharge Instructions (Signed)
Rapid COVID negative. Awaiting PCR test results.  No evidence of stone or UTI.  I suspect this is COVID.  Rest. Fluids.  Zofran as needed.  Tylenol and Ibuprofen as needed.  Take care  Dr. Lacinda Axon

## 2019-04-04 NOTE — ED Provider Notes (Signed)
[facility]    CSN: [id number] Arrival date & time: 04/04/19  [id number]      History   Chief Complaint Chief Complaint  Patient presents with  . Fever   HPI 48 year old female presents for evaluation regarding fever.  Patient reports that she developed symptoms last night.  She reports sudden onset fever, body aches, nausea.  Patient currently afebrile.  She reports that she developed flank pain this morning (right sided).  Rates her pain as 5/10 in severity.  She had improvement in fever with over-the-counter pyretics.  Denies sick contacts.  Denies urinary symptoms. No known exacerbating factors.  No other reported symptoms.  No other complaints.  PMH, Surgical Hx, Family Hx, Social History reviewed and updated as below.  PMH: GERD, Morbid obesity, Hx of Kidney stone  Past Surgical History:  Procedure Laterality Date  . CHOLECYSTECTOMY     OB History   No obstetric history on file.    Home Medications    Prior to Admission medications   Medication Sig Start Date End Date Taking? Authorizing Provider  dicyclomine (BENTYL) 10 MG capsule Take 10 mg by mouth 4 (four) times daily -  before meals and at bedtime.   Yes [provider]  ondansetron (ZOFRAN) 4 MG tablet Take 1 tablet (4 mg total) by mouth every 8 (eight) hours as needed for nausea or vomiting. 04/04/19   Coral Spikes, DO  fluticasone (FLONASE) 50 MCG/ACT nasal spray Place 2 sprays into both nostrils daily. 10/12/16 04/04/19  Lorin Picket, PA-C    Family History Family History  Problem Relation Age of Onset  . Heart failure Mother   . Heart failure Father     Social History Social History   Tobacco Use  . Smoking status: Never Smoker  . Smokeless tobacco: Never Used  Substance Use Topics  . Alcohol use: No  . Drug use: No     Allergies   Keflex [cephalexin]   Review of Systems Review of Systems  Constitutional: Positive for fever.  Gastrointestinal: Positive for nausea.    Genitourinary: Positive for flank pain.  Musculoskeletal:       Body aches.   Physical Exam Triage Vital Signs ED Triage Vitals  Enc Vitals Group     BP 04/04/19 0838 125/77     Pulse Rate 04/04/19 0838 92     Resp 04/04/19 0838 18     Temp 04/04/19 0838 98.6 F (37 C)     Temp Source 04/04/19 0838 Oral     SpO2 04/04/19 0838 98 %     Weight 04/04/19 0835 (!) 315 lb (142.9 kg)     Height 04/04/19 0835 5\' 8"  (1.727 m)     Head Circumference --      Peak Flow --      Pain Score 04/04/19 0835 5     Pain Loc --      Pain Edu? --      Excl. in [place]? --    Updated Vital Signs BP 125/77 (BP Location: Left Arm)   Pulse 92   Temp 98.6 F (37 C) (Oral)   Resp 18   Ht 5\' 8"  (1.727 m)   Wt (!) 142.9 kg   LMP 03/30/2019   SpO2 98%   BMI 47.90 kg/m   Visual Acuity Right Eye Distance:   Left Eye Distance:   Bilateral Distance:    Right Eye Near:   Left Eye Near:    Bilateral Near:  Physical Exam Constitutional:      General: She is not in acute distress.    Appearance: She is obese.     Comments: Mildly ill appearing.   HENT:     Head: Normocephalic and atraumatic.     Nose: Nose normal.     Mouth/Throat:     Pharynx: Oropharynx is clear.  Eyes:     General:        Right eye: No discharge.        Left eye: No discharge.     Conjunctiva/sclera: Conjunctivae normal.  Cardiovascular:     Rate and Rhythm: Normal rate and regular rhythm.     Heart sounds: No murmur.  Pulmonary:     Effort: Pulmonary effort is normal.     Breath sounds: Normal breath sounds. No wheezing, rhonchi or rales.  Abdominal:     Tenderness: There is right CVA tenderness.  Skin:    General: Skin is warm.     Findings: No rash.  Neurological:     Mental Status: She is alert.  Psychiatric:        Mood and Affect: Mood normal.        Behavior: Behavior normal.    UC Treatments / Results  Labs (all labs ordered are listed, but only abnormal results are displayed) Labs Reviewed   URINALYSIS, COMPLETE (UACMP) WITH MICROSCOPIC - Abnormal; Notable for the following components:      Result Value   Hgb urine dipstick SMALL (*)    All other components within normal limits  SARS CORONAVIRUS 2 AG (30 MIN TAT)  NOVEL CORONAVIRUS, NAA (HOSP ORDER, SEND-OUT TO REF LAB; TAT 18-24 HRS)    EKG   Radiology No results found.  Procedures Procedures (including critical care time)  Medications Ordered in UC Medications - No data to display  Initial Impression / Assessment and Plan / UC Course  I have reviewed the triage vital signs and the nursing notes.  Pertinent labs & imaging results that were available during my care of the patient were reviewed by me and considered in my medical decision making (see chart for details).    48 year old female presents with a febrile illness.  Suspect COVID-19.  Rapid Covid negative.  Awaiting PCR test results.  Patient reported flank pain.  Urinalysis negative.  Zofran as needed for nausea vomiting.  Tylenol and ibuprofen as needed for fever and body aches.  Supportive care.  Stay home.  Final Clinical Impressions(s) / UC Diagnoses   Final diagnoses:  Febrile illness     Discharge Instructions     Rapid COVID negative. Awaiting PCR test results.  No evidence of stone or UTI.  I suspect this is COVID.  Rest. Fluids.  Zofran as needed.  Tylenol and Ibuprofen as needed.  Take care  Dr. Lacinda Axon     ED Prescriptions    Medication Sig Dispense Auth. Provider   ondansetron (ZOFRAN) 4 MG tablet Take 1 tablet (4 mg total) by mouth every 8 (eight) hours as needed for nausea or vomiting. 20 tablet Coral Spikes, DO     PDMP not reviewed this encounter.   Teresa Cameron, [place] 04/04/19 [phone]

## 2019-04-04 NOTE — ED Triage Notes (Signed)
Patient states that she has been having body aches, nausea, and fever since yesterday with a sudden onset.

## 2019-04-05 LAB — NOVEL CORONAVIRUS, NAA (HOSP ORDER, SEND-OUT TO REF LAB; TAT 18-24 HRS): SARS-CoV-2, NAA: NOT DETECTED

## 2020-03-29 ENCOUNTER — Encounter: Payer: Self-pay | Admitting: Emergency Medicine

## 2020-03-29 ENCOUNTER — Ambulatory Visit
Admission: EM | Admit: 2020-03-29 | Discharge: 2020-03-29 | Disposition: A | Discharge status: Home / Self Care | Payer: BC Managed Care – PPO | Attending: Family Medicine | Admitting: Family Medicine

## 2020-03-29 ENCOUNTER — Other Ambulatory Visit: Payer: Self-pay

## 2020-03-29 DIAGNOSIS — Z881 Allergy status to other antibiotic agents status: Secondary | ICD-10-CM | POA: Insufficient documentation

## 2020-03-29 DIAGNOSIS — Z20822 Contact with and (suspected) exposure to covid-19: Secondary | ICD-10-CM | POA: Diagnosis not present

## 2020-03-29 DIAGNOSIS — Z79899 Other long term (current) drug therapy: Secondary | ICD-10-CM | POA: Insufficient documentation

## 2020-03-29 DIAGNOSIS — R059 Cough, unspecified: Secondary | ICD-10-CM | POA: Insufficient documentation

## 2020-03-29 DIAGNOSIS — H6502 Acute serous otitis media, left ear: Secondary | ICD-10-CM | POA: Diagnosis not present

## 2020-03-29 LAB — RESP PANEL BY RT-PCR (FLU A&B, COVID) ARPGX2
Influenza A by PCR: NEGATIVE
Influenza B by PCR: NEGATIVE
SARS Coronavirus 2 by RT PCR: NEGATIVE

## 2020-03-29 MED ORDER — AMOXICILLIN-POT CLAVULANATE 875-125 MG PO TABS: 1.0000 | ORAL_TABLET | Freq: Two times a day (BID) | ORAL | 0 refills | Status: DC

## 2020-03-29 NOTE — Discharge Instructions (Signed)
Medication as prescribed.  Tylenol/Ibuprofen as needed for fever/body aches.  Take care  Dr. Lacinda Axon

## 2020-03-29 NOTE — ED Triage Notes (Signed)
Patient c/o cough, chest congestion, sinus congestion and fever that started on Thursday.

## 2020-03-29 NOTE — ED Provider Notes (Signed)
[facility]    CSN: [id number] Arrival date & time: 03/29/20  1015  History   Chief Complaint Chief Complaint  Patient presents with  . Cough  . Fever   HPI  49 year old female presents with respiratory symptoms.   Patient reports she has been sick since Thursday.  Reports diarrhea, sore throat, sinus pain and pressure, sinus congestion, cough, and body aches.  She has had a low-grade temperature of 100.  Feels very poorly.  Has had a recent sick contact.  No relieving factors.  She also reports ear pain.  Pain currently 4/10 in severity.  No other associated symptoms.  No other complaints.   Home Medications    Prior to Admission medications   Medication Sig Start Date End Date Taking? Authorizing Provider  amoxicillin-clavulanate (AUGMENTIN) 875-125 MG tablet Take 1 tablet by mouth 2 (two) times daily. 03/29/20   Coral Spikes, DO  dicyclomine (BENTYL) 10 MG capsule Take 10 mg by mouth 4 (four) times daily -  before meals and at bedtime.    [provider]  ondansetron (ZOFRAN) 4 MG tablet Take 1 tablet (4 mg total) by mouth every 8 (eight) hours as needed for nausea or vomiting. 04/04/19   Coral Spikes, DO  fluticasone (FLONASE) 50 MCG/ACT nasal spray Place 2 sprays into both nostrils daily. 10/12/16 04/04/19  Lorin Picket, PA-C    Family History Family History  Problem Relation Age of Onset  . Heart failure Mother   . Heart failure Father     Social History Social History   Tobacco Use  . Smoking status: Never Smoker  . Smokeless tobacco: Never Used  Vaping Use  . Vaping Use: Never used  Substance Use Topics  . Alcohol use: No  . Drug use: No     Allergies   Keflex [cephalexin]   Review of Systems Review of Systems Per HPI  Physical Exam Triage Vital Signs ED Triage Vitals  Enc Vitals Group     BP 03/29/20 1117 (!) 143/80     Pulse Rate 03/29/20 1117 94     Resp 03/29/20 1117 16     Temp 03/29/20 1117 98.6 F (37 C)      Temp Source 03/29/20 1117 Oral     SpO2 03/29/20 1117 98 %     Weight 03/29/20 1115 300 lb (136.1 kg)     Height 03/29/20 1115 5\' 8"  (1.727 m)     Head Circumference --      Peak Flow --      Pain Score 03/29/20 1114 4     Pain Loc --      Pain Edu? --      Excl. in [place]? --    Updated Vital Signs BP (!) 143/80 (BP Location: Left Arm)   Pulse 94   Temp 98.6 F (37 C) (Oral)   Resp 16   Ht 5\' 8"  (1.727 m)   Wt 136.1 kg   LMP 03/08/2020 (Approximate)   SpO2 98%   BMI 45.61 kg/m   Visual Acuity Right Eye Distance:   Left Eye Distance:   Bilateral Distance:    Right Eye Near:   Left Eye Near:    Bilateral Near:     Physical Exam Vitals and nursing note reviewed.  Constitutional:      General: She is not in acute distress.    Appearance: Normal appearance. She is obese. She is not ill-appearing.  HENT:  Head: Normocephalic and atraumatic.     Right Ear: There is impacted cerumen.     Ears:     Comments: Left TM -dullness, erythema, effusion. Eyes:     General:        Right eye: No discharge.        Left eye: No discharge.     Conjunctiva/sclera: Conjunctivae normal.  Cardiovascular:     Rate and Rhythm: Normal rate and regular rhythm.  Pulmonary:     Effort: Pulmonary effort is normal.     Breath sounds: Normal breath sounds. No wheezing, rhonchi or rales.  Neurological:     Mental Status: She is alert.  Psychiatric:        Mood and Affect: Mood normal.        Behavior: Behavior normal.    UC Treatments / Results  Labs (all labs ordered are listed, but only abnormal results are displayed) Labs Reviewed  RESP PANEL BY RT-PCR (FLU A&B, COVID) [id number]    EKG   Radiology No results found.  Procedures Procedures (including critical care time)  Medications Ordered in UC Medications - No data to display  Initial Impression / Assessment and Plan / UC Course  I have reviewed the triage vital signs and the nursing notes.  Pertinent labs & imaging  results that were available during my care of the patient were reviewed by me and considered in my medical decision making (see chart for details).    49 year old female presents with otitis media. Treating with Augmentin.  Final Clinical Impressions(s) / UC Diagnoses   Final diagnoses:  Acute serous otitis media of left ear, recurrence not specified     Discharge Instructions     Medication as prescribed.  Tylenol/Ibuprofen as needed for fever/body aches.  Take care  Dr. Lacinda Axon    ED Prescriptions    Medication Sig Dispense Auth. Provider   amoxicillin-clavulanate (AUGMENTIN) 875-125 MG tablet Take 1 tablet by mouth 2 (two) times daily. 20 tablet Coral Spikes, DO     PDMP not reviewed this encounter.   Coral Spikes, [place] 03/29/20 1306

## 2020-11-13 ENCOUNTER — Encounter: Payer: Self-pay | Admitting: Emergency Medicine

## 2020-11-13 ENCOUNTER — Ambulatory Visit
Admission: EM | Admit: 2020-11-13 | Discharge: 2020-11-13 | Disposition: A | Discharge status: Home / Self Care | Payer: BC Managed Care – PPO | Source: Home / Self Care | Attending: Sports Medicine | Admitting: Sports Medicine

## 2020-11-13 ENCOUNTER — Other Ambulatory Visit: Payer: Self-pay

## 2020-11-13 ENCOUNTER — Emergency Department: Payer: BC Managed Care – PPO

## 2020-11-13 ENCOUNTER — Emergency Department
Admission: EM | Admit: 2020-11-13 | Discharge: 2020-11-14 | Disposition: A | Discharge status: Home / Self Care | Payer: BC Managed Care – PPO | Attending: Student in an Organized Health Care Education/Training Program | Admitting: Student in an Organized Health Care Education/Training Program

## 2020-11-13 DIAGNOSIS — R011 Cardiac murmur, unspecified: Secondary | ICD-10-CM | POA: Insufficient documentation

## 2020-11-13 DIAGNOSIS — N9489 Other specified conditions associated with female genital organs and menstrual cycle: Secondary | ICD-10-CM | POA: Insufficient documentation

## 2020-11-13 DIAGNOSIS — N939 Abnormal uterine and vaginal bleeding, unspecified: Secondary | ICD-10-CM | POA: Diagnosis not present

## 2020-11-13 DIAGNOSIS — K219 Gastro-esophageal reflux disease without esophagitis: Secondary | ICD-10-CM | POA: Insufficient documentation

## 2020-11-13 DIAGNOSIS — N920 Excessive and frequent menstruation with regular cycle: Secondary | ICD-10-CM

## 2020-11-13 DIAGNOSIS — R0789 Other chest pain: Secondary | ICD-10-CM

## 2020-11-13 DIAGNOSIS — D649 Anemia, unspecified: Secondary | ICD-10-CM

## 2020-11-13 DIAGNOSIS — R5383 Other fatigue: Secondary | ICD-10-CM

## 2020-11-13 DIAGNOSIS — D509 Iron deficiency anemia, unspecified: Secondary | ICD-10-CM

## 2020-11-13 DIAGNOSIS — J45909 Unspecified asthma, uncomplicated: Secondary | ICD-10-CM | POA: Insufficient documentation

## 2020-11-13 LAB — CBC WITH DIFFERENTIAL/PLATELET
Abs Immature Granulocytes: 0.02 10*3/uL (ref 0.00–0.07)
Basophils Absolute: 0 10*3/uL (ref 0.0–0.1)
Basophils Relative: 1 %
Eosinophils Absolute: 0.2 10*3/uL (ref 0.0–0.5)
Eosinophils Relative: 3 %
HCT: 20.9 % — ABNORMAL LOW (ref 36.0–46.0)
Hemoglobin: 6 g/dL — ABNORMAL LOW (ref 12.0–15.0)
Immature Granulocytes: 0 %
Lymphocytes Relative: 27 %
Lymphs Abs: 1.6 10*3/uL (ref 0.7–4.0)
MCH: 21.1 pg — ABNORMAL LOW (ref 26.0–34.0)
MCHC: 28.7 g/dL — ABNORMAL LOW (ref 30.0–36.0)
MCV: 73.6 fL — ABNORMAL LOW (ref 80.0–100.0)
Monocytes Absolute: 0.6 10*3/uL (ref 0.1–1.0)
Monocytes Relative: 10 %
Neutro Abs: 3.6 10*3/uL (ref 1.7–7.7)
Neutrophils Relative %: 59 %
Platelets: 238 10*3/uL (ref 150–400)
RBC: 2.84 MIL/uL — ABNORMAL LOW (ref 3.87–5.11)
RDW: 19 % — ABNORMAL HIGH (ref 11.5–15.5)
WBC: 6.1 10*3/uL (ref 4.0–10.5)
nRBC: 0 % (ref 0.0–0.2)

## 2020-11-13 LAB — ABO/RH: ABO/RH(D): A POS

## 2020-11-13 LAB — COMPREHENSIVE METABOLIC PANEL
ALT: 11 U/L (ref 0–44)
ALT: 12 U/L (ref 0–44)
AST: 18 U/L (ref 15–41)
AST: 19 U/L (ref 15–41)
Albumin: 3.7 g/dL (ref 3.5–5.0)
Albumin: 3.9 g/dL (ref 3.5–5.0)
Alkaline Phosphatase: 81 U/L (ref 38–126)
Alkaline Phosphatase: 75 U/L (ref 38–126)
Anion gap: 6 (ref 5–15)
Anion gap: 8 (ref 5–15)
BUN: 14 mg/dL (ref 6–20)
BUN: 13 mg/dL (ref 6–20)
CO2: 25 mmol/L (ref 22–32)
CO2: 24 mmol/L (ref 22–32)
Calcium: 8.4 mg/dL — ABNORMAL LOW (ref 8.9–10.3)
Calcium: 8.8 mg/dL — ABNORMAL LOW (ref 8.9–10.3)
Chloride: 103 mmol/L (ref 98–111)
Chloride: 105 mmol/L (ref 98–111)
Creatinine, Ser: 0.76 mg/dL (ref 0.44–1.00)
Creatinine, Ser: 0.72 mg/dL (ref 0.44–1.00)
GFR, Estimated: >60 mL/min (ref >60)
GFR, Estimated: >60 mL/min (ref >60)
Glucose, Bld: 102 mg/dL — ABNORMAL HIGH (ref 70–99)
Glucose, Bld: 101 mg/dL — ABNORMAL HIGH (ref 70–99)
Potassium: 3.5 mmol/L (ref 3.5–5.1)
Potassium: 3.7 mmol/L (ref 3.5–5.1)
Sodium: 134 mmol/L — ABNORMAL LOW (ref 135–145)
Sodium: 137 mmol/L (ref 135–145)
Total Bilirubin: 0.4 mg/dL (ref 0.3–1.2)
Total Bilirubin: 0.7 mg/dL (ref 0.3–1.2)
Total Protein: 7.4 g/dL (ref 6.5–8.1)
Total Protein: 7.3 g/dL (ref 6.5–8.1)

## 2020-11-13 LAB — PREPARE RBC (CROSSMATCH)

## 2020-11-13 LAB — CBC
HCT: 21.6 % — ABNORMAL LOW (ref 36.0–46.0)
Hemoglobin: 6.1 g/dL — ABNORMAL LOW (ref 12.0–15.0)
MCH: 21.4 pg — ABNORMAL LOW (ref 26.0–34.0)
MCHC: 28.2 g/dL — ABNORMAL LOW (ref 30.0–36.0)
MCV: 75.8 fL — ABNORMAL LOW (ref 80.0–100.0)
Platelets: 224 10*3/uL (ref 150–400)
RBC: 2.85 MIL/uL — ABNORMAL LOW (ref 3.87–5.11)
RDW: 18.6 % — ABNORMAL HIGH (ref 11.5–15.5)
WBC: 6.6 10*3/uL (ref 4.0–10.5)
nRBC: 0.5 % — ABNORMAL HIGH (ref 0.0–0.2)

## 2020-11-13 LAB — HCG, QUANTITATIVE, PREGNANCY: hCG, Beta Chain, Quant, S: 2 m[IU]/mL (ref <5)

## 2020-11-13 LAB — POC URINE PREG, ED: Preg Test, Ur: NEGATIVE

## 2020-11-13 MED ORDER — SODIUM CHLORIDE 0.9 % IV SOLN: 10.0000 mL/h | Freq: Once | INTRAVENOUS | Status: DC

## 2020-11-13 MED ORDER — ALBUTEROL SULFATE HFA 108 (90 BASE) MCG/ACT IN AERS: 1.0000 | INHALATION_SPRAY | Freq: Four times a day (QID) | RESPIRATORY_TRACT | 0 refills | Status: AC | PRN

## 2020-11-13 NOTE — ED Provider Notes (Signed)
  Patient received in signout from Dr. Quentin Cornwall pending pelvic ultrasound and transfusion of 2 units of blood in the setting of symptomatic anemia and abnormal uterine bleeding.  Patient received 2 units without evidence of transfusion reaction and reports resolution of symptoms.  She reports feeling well and is ready to go home.  We discussed her pelvic ultrasound results with enlarged endometrium and discussed possible etiologies of this.  We discussed the importance of following up with OB/GYN as an outpatient and return precautions for the ED were discussed.  Patient suitable for outpatient management.   Vladimir Crofts, MD 11/13/20 2242

## 2020-11-13 NOTE — ED Provider Notes (Signed)
[facility] Emergency Department Provider Note    Event Date/Time   First MD Initiated Contact with Patient 11/13/20 1351     (approximate)  I have reviewed the triage vital signs and the nursing notes.   HISTORY  Chief Complaint Anemia    HPI Teresa Cameron is a 50 y.o. female below listed past medical history presents to the ER for generalized malaise and fatigue.  States she was at [facility] urgent care today and had blood work showing that she was anemic.  She is not on any blood thinners.  States that she has been having months of heavy menstrual cycles that are regular.  States her mother had similar episodes and had hysterectomy but denied any history of malignancy.  She denies any pain.  Is been more than a week since she had any recent vaginal bleeding.  Denies any discharge.  Past Medical History:  Diagnosis Date   Asthma    Renal disorder    Family History  Problem Relation Age of Onset   Heart failure Mother    Heart failure Father    Past Surgical History:  Procedure Laterality Date   CHOLECYSTECTOMY     There are no problems to display for this patient.     Prior to Admission medications   Medication Sig Start Date End Date Taking? Authorizing Provider  albuterol (VENTOLIN HFA) 108 (90 Base) MCG/ACT inhaler Inhale 1-2 puffs into the lungs every 6 (six) hours as needed for wheezing or shortness of breath. 11/13/20   Verda Cumins, MD  amoxicillin-clavulanate (AUGMENTIN) 875-125 MG tablet Take 1 tablet by mouth 2 (two) times daily. 03/29/20   Coral Spikes, DO  dicyclomine (BENTYL) 10 MG capsule Take 10 mg by mouth 4 (four) times daily -  before meals and at bedtime.    [provider]  ondansetron (ZOFRAN) 4 MG tablet Take 1 tablet (4 mg total) by mouth every 8 (eight) hours as needed for nausea or vomiting. 04/04/19   Coral Spikes, DO  fluticasone (FLONASE) 50 MCG/ACT nasal spray Place 2 sprays into both nostrils  daily. 10/12/16 04/04/19  Lorin Picket, PA-C    Allergies Keflex [cephalexin]    Social History Social History   Tobacco Use   Smoking status: Never   Smokeless tobacco: Never  Vaping Use   Vaping Use: Never used  Substance Use Topics   Alcohol use: No   Drug use: No    Review of Systems Patient denies headaches, rhinorrhea, blurry vision, numbness, shortness of breath, chest pain, edema, cough, abdominal pain, nausea, vomiting, diarrhea, dysuria, fevers, rashes or hallucinations unless otherwise stated above in HPI. ____________________________________________   PHYSICAL EXAM:  VITAL SIGNS: Vitals:   11/13/20 1415 11/13/20 1450  BP:  135/77  Pulse: 62 62  Resp: 13 13  Temp:    SpO2: 98% 99%    Constitutional: Alert and oriented.  Eyes: Conjunctivae are normal.  Head: Atraumatic. Nose: No congestion/rhinnorhea. Mouth/Throat: Mucous membranes are moist.   Neck: No stridor. Painless ROM.  Cardiovascular: Normal rate, regular rhythm. Grossly normal heart sounds.  Good peripheral circulation. Respiratory: Normal respiratory effort.  No retractions. Lungs CTAB. Gastrointestinal: Soft and nontender. No distention. No abdominal bruits. No CVA tenderness. Genitourinary:  Musculoskeletal: No lower extremity tenderness nor edema.  No joint effusions. Neurologic:  Normal speech and language. No gross focal neurologic deficits are appreciated. No facial droop Skin:  Skin is warm, dry and intact. No rash noted. Psychiatric: Mood  and affect are normal. Speech and behavior are normal.  ____________________________________________   LABS (all labs ordered are listed, but only abnormal results are displayed)  Results for orders placed or performed during the hospital encounter of 11/13/20 (from the past 24 hour(s))  Comprehensive metabolic panel     Status: Abnormal   Collection Time: 11/13/20 12:18 PM  Result Value Ref Range   Sodium 137 135 - 145 mmol/L   Potassium  3.7 3.5 - 5.1 mmol/L   Chloride 105 98 - 111 mmol/L   CO2 24 22 - 32 mmol/L   Glucose, Bld 101 (H) 70 - 99 mg/dL   BUN 13 6 - 20 mg/dL   Creatinine, Ser 0.72 0.44 - 1.00 mg/dL   Calcium 8.8 (L) 8.9 - 10.3 mg/dL   Total Protein 7.3 6.5 - 8.1 g/dL   Albumin 3.9 3.5 - 5.0 g/dL   AST 19 15 - 41 U/L   ALT 12 0 - 44 U/L   Alkaline Phosphatase 75 38 - 126 U/L   Total Bilirubin 0.7 0.3 - 1.2 mg/dL   GFR, Estimated >60 >60 mL/min   Anion gap 8 5 - 15  CBC     Status: Abnormal   Collection Time: 11/13/20 12:18 PM  Result Value Ref Range   WBC 6.6 4.0 - 10.5 K/uL   RBC 2.85 (L) 3.87 - 5.11 MIL/uL   Hemoglobin 6.1 (L) 12.0 - 15.0 g/dL   HCT 21.6 (L) 36.0 - 46.0 %   MCV 75.8 (L) 80.0 - 100.0 fL   MCH 21.4 (L) 26.0 - 34.0 pg   MCHC 28.2 (L) 30.0 - 36.0 g/dL   RDW 18.6 (H) 11.5 - 15.5 %   Platelets 224 150 - 400 K/uL   nRBC 0.5 (H) 0.0 - 0.2 %  Type and screen [facility]     Status: None   Collection Time: 11/13/20 12:18 PM  Result Value Ref Range   ABO/RH(D) A POS    Antibody Screen NEG    Sample Expiration      11/16/2020,2359 Performed at [facility], [address] [place], [place] [address]   Prepare RBC (crossmatch)     Status: None (Preliminary result)   Collection Time: 11/13/20  2:13 PM  Result Value Ref Range   Order Confirmation PENDING   POC urine preg, ED     Status: None   Collection Time: 11/13/20  2:47 PM  Result Value Ref Range   Preg Test, Ur Negative Negative   ____________________________________________  ____________________________________________  RADIOLOGY  I personally reviewed all radiographic images ordered to evaluate for the above acute complaints and reviewed radiology reports and findings.  These findings were personally discussed with the patient.  Please see medical record for radiology report.  ____________________________________________   PROCEDURES  Procedure(s) performed:  Procedures    Critical  Care performed: no ____________________________________________   INITIAL IMPRESSION / ASSESSMENT AND PLAN / ED COURSE  Pertinent labs & imaging results that were available during my care of the patient were reviewed by me and considered in my medical decision making (see chart for details).   DDX: DVT, a UB, menometrorrhagia, acute blood loss anemia  Vicie Brautigan is a 50 y.o. who presents to the ED with presentation as described above.  She is hemodynamically stable but does show evidence of acute anemia and given her symptoms will order blood transfusion.  Patient consents to transfusion.  Given duration of her symptoms will order ultrasound of  the pelvis to evaluate for anatomic abnormality.  She is not having any active bleeding at this time.  Patient be signed out to oncoming physician pending follow-up ultrasound.  Dissipate she will be appropriate for discharge home after transfusion.     The patient was evaluated in Emergency Department today for the symptoms described in the history of present illness. He/she was evaluated in the context of the global COVID-19 pandemic, which necessitated consideration that the patient might be at risk for infection with the SARS-CoV-2 virus that causes COVID-19. Institutional protocols and algorithms that pertain to the evaluation of patients at risk for COVID-19 are in a state of rapid change based on information released by regulatory bodies including the CDC and federal and state organizations. These policies and algorithms were followed during the patient's care in the ED.  As part of my medical decision making, I reviewed the following data within the [place] notes reviewed and incorporated, Labs reviewed, notes from prior ED visits and [place] Controlled Substance Database   ____________________________________________   FINAL CLINICAL IMPRESSION(S) / ED DIAGNOSES  Final diagnoses:  Anemia, unspecified type   Vaginal bleeding      NEW MEDICATIONS STARTED DURING THIS VISIT:  New Prescriptions   No medications on file     Note:  This document was prepared using Dragon voice recognition software and may include unintentional dictation errors.    Merlyn Lot, MD 11/13/20 [phone]

## 2020-11-13 NOTE — ED Triage Notes (Signed)
Arrives from [facility] for blood transfusion.  Patient is AAOx3.  Pale.  Skin warm and dry.  H/H:  6/20.9

## 2020-11-13 NOTE — ED Triage Notes (Signed)
Patient reports ongoing chest pain and SOB that started several months ago.  Patient states that her symptoms have gotten worse over this past week.  Patient denies cold symptoms.  Patient states that her chest feels congested and has HAs.

## 2020-11-13 NOTE — ED Provider Notes (Signed)
[facility]    CSN: [id number] Arrival date & time: 11/13/20  0809      History   Chief Complaint Chief Complaint  Patient presents with   Chest Pain   Shortness of Breath    HPI Teresa Cameron is a 50 y.o. female.   Patient is a 50 year old female who presents for evaluation of several months of chest tightness and some mild shortness of breath with exertion.  She denies a history of asthma, COPD, smoking, or significant seasonal allergies.  She has had no intermittent claudication, no calf pain or swelling, no diaphoresis, or concerns for a vascular event.  She does have some intermittent headaches without any vision changes.  She reports that over the past week or so her symptoms seem to be worsening somewhat.  She denies URI symptoms.  No cough, nasal congestion, fever shakes chills, sore throat, or ear pain.  She also reports that she had similar symptoms back in 2017 and was seen by cardiology.  They did a full work-up and it was not found to be cardiac in nature.  Further history, reveals that she has had a history of heavy periods.  She is not followed by OB/GYN.  She says that at times when she has periods and can last several weeks and she has clots during that time.  She does report fatigue, and this is exacerbated by exertion.  She is wondering whether or not this is due to some blood loss that she is having due to her menses.  She denies any vaginal discharge.  No concern for STD.  She denies any abdominal pain or flank pain.  She denies any urinary symptoms including dysuria, hematuria, increased urinary frequency or urgency.  She reports that she does not have a primary care physician.  She works in [employer] and works the night shift.  She came from her shift to the urgent care this morning.  She is asking for some form of a work-up and if she needs to be referred to the ER she is fine with that.  She denies any significant red flag signs or symptoms.  Chart was  reviewed in detail.  Extensive review of systems is negative except for what is detailed above.     Past Medical History:  Diagnosis Date   Asthma    Renal disorder     There are no problems to display for this patient.   Past Surgical History:  Procedure Laterality Date   CHOLECYSTECTOMY      OB History   No obstetric history on file.      Home Medications    Prior to Admission medications   Medication Sig Start Date End Date Taking? Authorizing Provider  albuterol (VENTOLIN HFA) 108 (90 Base) MCG/ACT inhaler Inhale 1-2 puffs into the lungs every 6 (six) hours as needed for wheezing or shortness of breath. 11/13/20  Yes Verda Cumins, MD  amoxicillin-clavulanate (AUGMENTIN) 875-125 MG tablet Take 1 tablet by mouth 2 (two) times daily. 03/29/20   Coral Spikes, DO  dicyclomine (BENTYL) 10 MG capsule Take 10 mg by mouth 4 (four) times daily -  before meals and at bedtime.    [provider]  ondansetron (ZOFRAN) 4 MG tablet Take 1 tablet (4 mg total) by mouth every 8 (eight) hours as needed for nausea or vomiting. 04/04/19   Coral Spikes, DO  fluticasone (FLONASE) 50 MCG/ACT nasal spray Place 2 sprays into both nostrils daily. 10/12/16 04/04/19  Lorin Picket, PA-C    Family History Family History  Problem Relation Age of Onset   Heart failure Mother    Heart failure Father     Social History Social History   Tobacco Use   Smoking status: Never   Smokeless tobacco: Never  Vaping Use   Vaping Use: Never used  Substance Use Topics   Alcohol use: No   Drug use: No     Allergies   Keflex [cephalexin]   Review of Systems Review of Systems  Constitutional:  Negative for activity change, appetite change, chills, diaphoresis, fatigue and fever.  HENT:  Negative for congestion, ear pain, postnasal drip, rhinorrhea, sinus pressure, sinus pain, sneezing and sore throat.   Eyes:  Negative for pain and visual disturbance.  Respiratory:  Positive for  chest tightness and shortness of breath. Negative for cough and wheezing.   Cardiovascular:  Negative for chest pain and palpitations.  Gastrointestinal:  Negative for abdominal pain, diarrhea, nausea and vomiting.  Genitourinary:  Positive for vaginal bleeding. Negative for decreased urine volume, dysuria, flank pain, frequency, hematuria, pelvic pain, urgency, vaginal discharge and vaginal pain.  Musculoskeletal:  Negative for back pain, myalgias and neck pain.  Skin:  Negative for color change, pallor, rash and wound.  Neurological:  Negative for dizziness, syncope, light-headedness, numbness and headaches.  All other systems reviewed and are negative.   Physical Exam Triage Vital Signs ED Triage Vitals [11/13/20 0825]  Enc Vitals Group     BP      Pulse      Resp      Temp      Temp src      SpO2      Weight (!) 360 lb (163.3 kg)     Height '5\' 8"'$  (1.727 m)     Head Circumference      Peak Flow      Pain Score 2     Pain Loc      Pain Edu?      Excl. in [place]?    No data found.  Updated Vital Signs BP 132/69 (BP Location: Left Arm)   Pulse 83   Temp 98 F (36.7 C) (Oral)   Resp 16   Ht '5\' 8"'$  (1.727 m)   Wt (!) 163.3 kg   LMP 11/09/2020 (Approximate)   SpO2 99%   BMI 54.74 kg/m   Visual Acuity Right Eye Distance:   Left Eye Distance:   Bilateral Distance:    Right Eye Near:   Left Eye Near:    Bilateral Near:     Physical Exam Vitals and nursing note reviewed.  Constitutional:      General: She is not in acute distress.    Appearance: Normal appearance. She is well-developed. She is obese. She is not ill-appearing, toxic-appearing or diaphoretic.  HENT:     Head: Normocephalic and atraumatic.     Nose: Nose normal. No congestion or rhinorrhea.     Mouth/Throat:     Mouth: Mucous membranes are moist.  Eyes:     General: No scleral icterus.       Right eye: No discharge.     Extraocular Movements: Extraocular movements intact.     Conjunctiva/sclera:  Conjunctivae normal.     Pupils: Pupils are equal, round, and reactive to light.  Neck:     Thyroid: No thyromegaly.     Vascular: No JVD.  Cardiovascular:     Rate and Rhythm: Normal rate and regular  rhythm.     Pulses: Normal pulses.          Carotid pulses are 2+ on the right side and 2+ on the left side.      Radial pulses are 2+ on the right side and 2+ on the left side.       Dorsalis pedis pulses are 2+ on the right side and 2+ on the left side.       Posterior tibial pulses are 2+ on the right side and 2+ on the left side.     Heart sounds: Normal heart sounds. No murmur heard. No diastolic murmur is present.    No friction rub. No gallop.  Pulmonary:     Effort: Pulmonary effort is normal.     Breath sounds: Normal breath sounds. No stridor. No decreased breath sounds, wheezing, rhonchi or rales.  Abdominal:     Palpations: Abdomen is soft. There is no hepatomegaly or splenomegaly.     Tenderness: There is no abdominal tenderness. There is no guarding or rebound.  Musculoskeletal:     Cervical back: Normal range of motion and neck supple.  Skin:    General: Skin is warm and dry.     Capillary Refill: Capillary refill takes less than 2 seconds.     Coloration: Skin is pale. Skin is not jaundiced.     Findings: No ecchymosis or erythema.  Neurological:     General: No focal deficit present.     Mental Status: She is alert and oriented to person, place, and time.     UC Treatments / Results  Labs (all labs ordered are listed, but only abnormal results are displayed) Labs Reviewed  CBC WITH DIFFERENTIAL/PLATELET - Abnormal; Notable for the following components:      Result Value   RBC 2.84 (*)    Hemoglobin 6.0 (*)    HCT 20.9 (*)    MCV 73.6 (*)    MCH 21.1 (*)    MCHC 28.7 (*)    RDW 19.0 (*)    All other components within normal limits  COMPREHENSIVE METABOLIC PANEL - Abnormal; Notable for the following components:   Sodium 134 (*)    Glucose, Bld 102 (*)     Calcium 8.4 (*)    All other components within normal limits    EKG Twelve-lead EKG obtained and interpreted by myself on November 13, 2020 at 8:35 AM showed normal sinus rhythm with a ventricular rate of 79 bpm.  There are some minimal nonspecific T wave abnormalities but no T wave inversion or ST elevation.  The QT was 402 ms, and a QTC was 460 ms which is a little prolonged.  We will await cardiology overread.  Radiology No results found.  Procedures Procedures (including critical care time)  Medications Ordered in UC Medications - No data to display  Initial Impression / Assessment and Plan / UC Course  I have reviewed the triage vital signs and the nursing notes.  Pertinent labs & imaging results that were available during my care of the patient were reviewed by me and considered in my medical decision making (see chart for details).  Clinical impression: 1.  Feeling of chest tightness 2.  Atypical chest pain 3.  Fatigue 4.  Menorrhagia 5.  Morbid obesity 6.  GERD 7.  Flow murmur on examination  Treatment plan: 1.  The findings and treatment plan were discussed in detail with the patient.  Patient was in agreement. 2.  Recommended getting some  blood work.  Results are in her chart.  Comprehensive metabolic profile shows a sodium that is a little low at 134, glucose a little elevated at 102, calcium is a little low at 8.4.  CBC shows an H&H of 6.0 and 20.9 with a microcytic component and an MCV of 73.6.  Platelets and white blood cells are within normal limits. 3.  Given the constellation of symptoms and the symptomatic anemia I have recommended that she go to [facility] emergency room for further evaluation and potential transfusion.  I did discuss the fact that given that this has been going on for a while she is slowly become anemic and is not manifesting the typical symptoms of somebody with a hemoglobin of 6.0.  That said, I am not comfortable sending her home and hopefully with the  transfusion she will see an improvement in her symptoms. 4.  She will need to follow-up with hematology and oncology given her symptomatic anemia. 5.  I have also recommended she follow-up with cardiology and she is seeing them 5 years ago.  That information was provided. 6.  I have also recommended that she follow-up with OB/GYN and I did give her the number for her to call. 7.  Since she does not have a PCP, I did put in a referral to hopefully get her some assistance. 8.  Educational handouts provided. 9.  She was discharged in stable condition.  I did talk to the attending in the emergency room who did agree with transfer to [facility] for transfusion.  Although she is symptomatic, she did work last evening and drove her own car here.  I feel she is safe enough to drive and her own personal vehicle. 10.  She was stable on discharge and will go directly to [facility] ER.    Final Clinical Impressions(s) / UC Diagnoses   Final diagnoses:  Feeling of chest tightness  Atypical chest pain  Fatigue, unspecified type  Menorrhagia with regular cycle  Morbid obesity (HCC)  Gastroesophageal reflux disease without esophagitis  Heart murmur  Symptomatic anemia  Microcytic anemia     Discharge Instructions      As we discussed, your EKG does not show any acute changes concerning for a cardiac issue.  That said, you do need to follow-up with cardiology and I have placed that referral. Your labs showed I put a referral in for you to follow-up with Dr. Clayborn Bigness as you have seen him in the past.  His last note said for you to follow-up if your symptoms were to recur or worsen. I also put in referral to OB/GYN for heavy periods. I also put a referral in for assistance to obtain a primary care provider so that the remainder of this work-up can be done in an outpatient setting. Educational handouts provided. If your symptoms were to worsen in any way then you need to go to the ER or call 911. I did send in a  medication to help with your chest tightness.  It is an inhaler that you have used in the past.  Please use as directed and when you follow-up with other providers let them know if this is assisting you.     ED Prescriptions     Medication Sig Dispense Auth. Provider   albuterol (VENTOLIN HFA) 108 (90 Base) MCG/ACT inhaler Inhale 1-2 puffs into the lungs every 6 (six) hours as needed for wheezing or shortness of breath. 1 each Verda Cumins, MD  PDMP not reviewed this encounter.   Verda Cumins, MD 11/13/20 1001

## 2020-11-13 NOTE — Discharge Instructions (Addendum)
As we discussed, your EKG does not show any acute changes concerning for a cardiac issue.  That said, you do need to follow-up with cardiology and I have placed that referral. Your labs showed I put a referral in for you to follow-up with Dr. Clayborn Bigness as you have seen him in the past.  His last note said for you to follow-up if your symptoms were to recur or worsen. I also put in referral to OB/GYN for heavy periods. I also put a referral in for assistance to obtain a primary care provider so that the remainder of this work-up can be done in an outpatient setting. Educational handouts provided. If your symptoms were to worsen in any way then you need to go to the ER or call 911. I did send in a medication to help with your chest tightness.  It is an inhaler that you have used in the past.  Please use as directed and when you follow-up with other providers let them know if this is assisting you.

## 2020-11-14 LAB — BPAM RBC
Blood Product Expiration Date: 202208202359
Blood Product Expiration Date: 202208292359
ISSUE DATE / TIME: 202208051556
ISSUE DATE / TIME: 202208051949
Unit Type and Rh: 6200
Unit Type and Rh: 6200

## 2020-11-14 LAB — TYPE AND SCREEN
ABO/RH(D): A POS
Antibody Screen: NEGATIVE
Unit division: 0
Unit division: 0

## 2020-11-14 NOTE — ED Notes (Signed)
E signature pad not working. Pt educated on discharge instructions and verbalized understanding.  

## 2020-12-25 ENCOUNTER — Other Ambulatory Visit: Payer: Self-pay | Admitting: Obstetrics and Gynecology

## 2020-12-25 DIAGNOSIS — Z1231 Encounter for screening mammogram for malignant neoplasm of breast: Secondary | ICD-10-CM

## 2021-01-21 ENCOUNTER — Other Ambulatory Visit: Payer: Self-pay

## 2021-01-21 ENCOUNTER — Ambulatory Visit
Admission: RE | Admit: 2021-01-21 | Discharge: 2021-01-21 | Disposition: A | Discharge status: Home / Self Care | Payer: BC Managed Care – PPO | Source: Ambulatory Visit | Attending: Obstetrics and Gynecology | Admitting: Obstetrics and Gynecology

## 2021-01-21 DIAGNOSIS — Z1231 Encounter for screening mammogram for malignant neoplasm of breast: Secondary | ICD-10-CM | POA: Diagnosis present

## 2021-02-25 ENCOUNTER — Telehealth: Payer: Self-pay

## 2021-02-25 NOTE — Telephone Encounter (Signed)
Referral received from Dr. Ouida Sills. Records reviewed. Called and arranged appointment for 03/03/21 with Dr. Fransisca Connors. Directions to the cancer center given. Went over what to expect during consult.

## 2021-03-03 ENCOUNTER — Inpatient Hospital Stay: Payer: BC Managed Care – PPO

## 2021-03-03 ENCOUNTER — Inpatient Hospital Stay: Payer: BC Managed Care – PPO | Attending: Obstetrics and Gynecology | Admitting: Obstetrics and Gynecology

## 2021-03-03 ENCOUNTER — Other Ambulatory Visit: Payer: Self-pay

## 2021-03-03 VITALS — BP 147/83 | HR 90 | Temp 97.8°F | Resp 17 | Wt 351.0 lb

## 2021-03-03 DIAGNOSIS — N903 Dysplasia of vulva, unspecified: Secondary | ICD-10-CM | POA: Insufficient documentation

## 2021-03-03 DIAGNOSIS — N900 Mild vulvar dysplasia: Secondary | ICD-10-CM | POA: Insufficient documentation

## 2021-03-03 DIAGNOSIS — D500 Iron deficiency anemia secondary to blood loss (chronic): Secondary | ICD-10-CM

## 2021-03-03 DIAGNOSIS — N870 Mild cervical dysplasia: Secondary | ICD-10-CM | POA: Diagnosis not present

## 2021-03-03 DIAGNOSIS — C541 Malignant neoplasm of endometrium: Secondary | ICD-10-CM

## 2021-03-03 DIAGNOSIS — K219 Gastro-esophageal reflux disease without esophagitis: Secondary | ICD-10-CM | POA: Diagnosis not present

## 2021-03-03 DIAGNOSIS — Z6841 Body Mass Index (BMI) 40.0 and over, adult: Secondary | ICD-10-CM | POA: Diagnosis not present

## 2021-03-03 DIAGNOSIS — Z79899 Other long term (current) drug therapy: Secondary | ICD-10-CM | POA: Insufficient documentation

## 2021-03-03 LAB — COMPREHENSIVE METABOLIC PANEL
ALT: 15 U/L (ref 0–44)
AST: 18 U/L (ref 15–41)
Albumin: 4.1 g/dL (ref 3.5–5.0)
Alkaline Phosphatase: 84 U/L (ref 38–126)
Anion gap: 8 (ref 5–15)
BUN: 11 mg/dL (ref 6–20)
CO2: 28 mmol/L (ref 22–32)
Calcium: 8.9 mg/dL (ref 8.9–10.3)
Chloride: 101 mmol/L (ref 98–111)
Creatinine, Ser: 0.75 mg/dL (ref 0.44–1.00)
GFR, Estimated: >60 mL/min (ref >60)
Glucose, Bld: 100 mg/dL — ABNORMAL HIGH (ref 70–99)
Potassium: 4.5 mmol/L (ref 3.5–5.1)
Sodium: 137 mmol/L (ref 135–145)
Total Bilirubin: <0.1 mg/dL — ABNORMAL LOW (ref 0.3–1.2)
Total Protein: 8.3 g/dL — ABNORMAL HIGH (ref 6.5–8.1)

## 2021-03-03 LAB — CBC WITH DIFFERENTIAL/PLATELET
Abs Immature Granulocytes: 0.02 10*3/uL (ref 0.00–0.07)
Basophils Absolute: 0 10*3/uL (ref 0.0–0.1)
Basophils Relative: 1 %
Eosinophils Absolute: 0.3 10*3/uL (ref 0.0–0.5)
Eosinophils Relative: 3 %
HCT: 34.3 % — ABNORMAL LOW (ref 36.0–46.0)
Hemoglobin: 9.9 g/dL — ABNORMAL LOW (ref 12.0–15.0)
Immature Granulocytes: 0 %
Lymphocytes Relative: 22 %
Lymphs Abs: 1.7 10*3/uL (ref 0.7–4.0)
MCH: 24.1 pg — ABNORMAL LOW (ref 26.0–34.0)
MCHC: 28.9 g/dL — ABNORMAL LOW (ref 30.0–36.0)
MCV: 83.7 fL (ref 80.0–100.0)
Monocytes Absolute: 0.9 10*3/uL (ref 0.1–1.0)
Monocytes Relative: 11 %
Neutro Abs: 4.9 10*3/uL (ref 1.7–7.7)
Neutrophils Relative %: 63 %
Platelets: 233 10*3/uL (ref 150–400)
RBC: 4.1 MIL/uL (ref 3.87–5.11)
RDW: 19.5 % — ABNORMAL HIGH (ref 11.5–15.5)
WBC: 7.8 10*3/uL (ref 4.0–10.5)
nRBC: 0 % (ref 0.0–0.2)

## 2021-03-03 LAB — IRON AND TIBC
Iron: 43 ug/dL (ref 28–170)
Saturation Ratios: 7 % — ABNORMAL LOW (ref 10.4–31.8)
TIBC: 602 ug/dL — ABNORMAL HIGH (ref 250–450)
UIBC: 559 ug/dL

## 2021-03-03 LAB — FERRITIN: Ferritin: 4 ng/mL — ABNORMAL LOW (ref 11–307)

## 2021-03-03 NOTE — Patient Instructions (Signed)
DIVISION OF GYNECOLOGIC ONCOLOGY BOWEL PREP   The following instructions are extremely important to prepare for your surgery. Please follow them carefully   Step 1: Liquid Diet Instructions              Clear Liquid Diet for GYN Oncology Patients Day Before Surgery The day before your scheduled surgery DO NOT EAT any solid foods.  We do want you to drink enough liquids, but NO MILK products.  We do not want you to be dehydrated.  Clear liquids are defined as no milk products and no pieces of any solid food. Drink at least 64 oz. of fluid.  The following are all approved for you to drink the day before you surgery. Chicken, Beef or Vegetable Broth (bouillon or consomm) - NO BROTH AFTER MIDNIGHT Plain Jello  (no fruit) Water Strained lemonade or fruit punch Gatorade (any flavor) CLEAR Ensure or Boost Breeze Fruit juices without pulp, such as apple, grape, or cranberry juice Clear sodas - NO SODA AFTER MIDNIGHT Ice Pops without bits of fruit or fruit pulp Honey Tea or coffee without milk or cream                 Any foods not on the above list should be avoided                                                                                             Step 2: Laxatives           The evening before surgery:   Time: around 5pm   Follow these instructions carefully.   Administer 1 Dulcolax suppository according to manufacturer instructions on the box. You will need to purchase this laxative at a pharmacy or grocery store.    Individual responses to laxatives vary; this prep may cause multiple bowel movements. It often works in 30 minutes and may take as long as 3 hours. Stay near an available bathroom.    It is important to stay hydrated. Ensure you are still drinking clear liquids.     IMPORTANT: FOR YOUR SAFETY, WE WILL HAVE TO CANCEL YOUR SURGERY IF YOU DO NOT FOLLOW THESE INSTRUCTIONS.   Do not eat anything after midnight (including gum or candy) prior to your  surgery. Avoid drinking carbonated beverages after midnight. You can have clear liquids up until one hour before you arrive at the hospital. "Nothing by mouth" means no liquids, gum, candy, etc for one hour before your arrival time.      Laparoscopy Laparoscopy is a procedure to diagnose diseases in the abdomen. During the procedure, a thin, lighted, pencil-sized instrument called a laparoscope is inserted into the abdomen through an incision. The laparoscope allows your health care provider to look at the organs inside your body. LET YOUR HEALTH CARE PROVIDER KNOW ABOUT: Any allergies you have. All medicines you are taking, including vitamins, herbs, eye drops, creams, and over-the-counter medicines. Previous problems you or members of your family have had with the use of anesthetics. Any blood disorders you have. Previous surgeries you have had. Medical conditions you have. RISKS AND COMPLICATIONS  Generally, this   is a safe procedure. However, problems can occur, which may include: Infection. Bleeding. Damage to other organs. Allergic reaction to the anesthetics used during the procedure. BEFORE THE PROCEDURE Do not eat or drink anything after midnight on the night before the procedure or as directed by your health care provider. Ask your health care provider about: Changing or stopping your regular medicines. Taking medicines such as aspirin and ibuprofen. These medicines can thin your blood. Do not take these medicines before your procedure if your health care provider instructs you not to. Plan to have someone take you home after the procedure. PROCEDURE You may be given a medicine to help you relax (sedative). You will be given a medicine to make you sleep (general anesthetic). Your abdomen will be inflated with a gas. This will make your organs easier to see. Small incisions will be made in your abdomen. A laparoscope and other small instruments will be inserted into the abdomen  through the incisions. A tissue sample may be removed from an organ in the abdomen for examination. The instruments will be removed from the abdomen. The gas will be released. The incisions will be closed with stitches (sutures). AFTER THE PROCEDURE  Your blood pressure, heart rate, breathing rate, and blood oxygen level will be monitored often until the medicines you were given have worn off.   This information is not intended to replace advice given to you by your health care provider. Make sure you discuss any questions you have with your health care provider.                                             Bowel Symptoms After Surgery After gynecologic surgery, women often have temporary changes in bowel function (constipation and gas pain).  Following are tips to help prevent and treat common bowel problems.  It also tells you when to call the doctor.  This is important because some symptoms might be a sign of a more serious bowel problem such as obstruction (bowel blockage).  These problems are rare but can happen after gynecologic surgery.   Besides surgery, what can temporarily affect bowel function? 1. Dietary changes   2. Decreased physical activity   3.Antibiotics   4. Pain medication   How can I prevent constipation (three days or more without a stool)? Include fiber in your diet: whole grains, raw or dried fruits & vegetables, prunes, prune/pear juiceDrink at least 8 glasses of liquid (preferably water) every day Avoid: Gas forming foods such as broccoli, beans, peas, salads, cabbage, sweet potatoes Greasy, fatty, or fried foods Activity helps bowel function return to normal, walk around the house at least 3-4 times each day for 15 minutes or longer, if tolerated.  Rocking in a rocking chair is preferable to sitting still. Stool softeners: these are not laxatives, but serve to soften the stool to avoid straining.  Take 2-4 times a day until normal bowel function returns          Examples: Colace or generic equivalent (Docusate) Bulk laxatives: provide a concentrated source of fiber.  They do not stimulate the bowel.  Take 1-2 times each day until normal bowel function return.              Examples: Citrucel, Metamucil, Fiberal, Fibercon   What can I take for "Gas Pains"? Simethicone (Mylicon, Gas-X, Maalox-Gas, Mylanta-Gas) take 3-4   times a day Maalox Regular - take 3-4 times a day Mylanta Regular - take 3-4 times a day   What can I take if I become constipated? Start with stool softeners and add additional laxatives below as needed to have a bowel movement every 1-2 days  Stool softeners 1-2 tablets, 2 times a day Senokot 1-2 tablets, 1-2 times a day Glycerin suppository can soften hard stool take once a day Bisacodyl suppository once a day  Milk of Magnesia 30 mL 1-2 times a day Fleets or tap water enema    What can I do for nausea?  Limit most solid foods for 24-48 hours Continue eating small frequent amounts of liquids and/or bland soft foods Toast, crackers, cooked cereal (grits, cream of wheat, rice) Benadryl: a mild anti-nausea medicine can be obtained without a prescription. May cause drowsiness, especially if taken with narcotic pain medicines Contact provider for prescription nausea medication     What can I do, or take for diarrhea (more than five loose stools per day)? Drink plenty of clear fluids to prevent dehydration May take Kaopectate, Pepto-Bismol, Imodium, or probiotics for 1-2 days Anusol or Preparation-H can be helpful for hemorrhoids and irritated tissue around anus   When should I call the doctor?             CONSTIPATION:  Not relieved after three days following the above program VOMITING: That contains blood, "coffee ground" material More the three times/hour and unable to keep down nausea medication for more than eight hours With dry mouth, dark or strong urine, feeling light-headed, dizzy, or confused With severe abdominal pain  or bloating for more than 24 hours DIARRHEA: That continues for more then 24-48 hours despite treatment That contains blood or tarry material With dry mouth, dark or strong urine, feeling light~headed, dizzy, or confused FEVER: 101 F or higher along with nausea, vomiting, gas pain, diarrhea UNABLE TO: Pass gas from rectum for more than 24 hours Tolerate liquids by mouth for more than 24 hours        Laparoscopic Hysterectomy, Care After Refer to this sheet in the next few weeks. These instructions provide you with information on caring for yourself after your procedure. Your health care provider may also give you more specific instructions. Your treatment has been planned according to current medical practices, but problems sometimes occur. Call your health care provider if you have any problems or questions after your procedure. What can I expect after the procedure? Pain and bruising at the incision sites. You will be given pain medicine to control it. Menopausal symptoms such as hot flashes, night sweats, and insomnia if your ovaries were removed. Sore throat from the breathing tube that was inserted during surgery. Follow these instructions at home: Only take over-the-counter or prescription medicines for pain, discomfort, or fever as directed by your health care provider. Do not take aspirin. It can cause bleeding. Do not drive when taking pain medicine. Follow your health care provider's advice regarding diet, exercise, lifting, driving, and general activities. Resume your usual diet as directed and allowed. Get plenty of rest and sleep. Do not douche, use tampons, or have sexual intercourse for at least 6 weeks, or until your health care provider gives you permission. Change your bandages (dressings) as directed by your health care provider. Monitor your temperature and notify your health care provider of a fever. Take showers instead of baths for 2-3 weeks. Do not drink alcohol  until your health care provider gives you   permission. If you develop constipation, you may take a mild laxative with your health care provider's permission. Bran foods may help with constipation problems. Drinking enough fluids to keep your urine clear or pale yellow may help as well. Try to have someone home with you for 1-2 weeks to help around the house. Keep all of your follow-up appointments as directed by your health care provider. Contact a health care provider if: You have swelling, redness, or increasing pain around your incision sites. You have pus coming from your incision. You notice a bad smell coming from your incision. Your incision breaks open. You feel dizzy or lightheaded. You have pain or bleeding when you urinate. You have persistent diarrhea. You have persistent nausea and vomiting. You have abnormal vaginal discharge. You have a rash. You have any type of abnormal reaction or develop an allergy to your medicine. You have poor pain control with your prescribed medicine. Get help right away if: You have chest pain or shortness of breath. You have severe abdominal pain that is not relieved with pain medicine. You have pain or swelling in your legs. This information is not intended to replace advice given to you by your health care provider. Make sure you discuss any questions you have with your health care provider. Document Released: 01/16/2013 Document Revised: 09/03/2015 Document Reviewed: 10/16/2012 Elsevier Interactive Patient Education  2017 [employer]                    

## 2021-03-03 NOTE — H&P (View-Only) (Signed)
Gynecologic Oncology Consult Visit   Referring Provider: Dr. Ouida Sills  Chief Concern: grade 1 endometrial cancer  Subjective:  Teresa Cameron is a 50 y.o. G1 female who is seen in consultation from Dr. Ouida Sills for grade 1 endometrial cancer  She has a long h/o irregular bleeding, bleeds q other month and at time extremely heavy clots . She has required a blood transfusion at [facility] 8/22. 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  H/h : 9/31 Ferritin =5  Family History: family history includes Heart disease in her maternal grandfather, mother, and paternal uncle; High blood pressure (Hypertension) in her father; Menstrual problems (age of onset: 30) in her mother; Myocardial Infarction (Heart attack) in her maternal grandfather and mother; No Known Problems in her brother.  Problem List: Patient Active Problem List   Diagnosis Date Noted   Endometrial cancer ([place]) 03/03/2021   Anemia due to chronic blood loss 03/03/2021   GERD (gastroesophageal reflux disease) 03/03/2021   Past Medical History: Past Medical History:  Diagnosis Date   Asthma    Renal disorder    Past Surgical History: Past Surgical History:  Procedure Laterality Date   CHOLECYSTECTOMY     Family History: Family History  Problem Relation Age of Onset   Heart failure Mother    Heart failure Father    Social History: Social History   Socioeconomic History   Marital status: Married    Spouse name: Not on file   Number of children: Not on file   Years of education: Not on file   Highest education level: Not on file  Occupational History   Not on file  Tobacco Use   Smoking status: Never   Smokeless tobacco: Never  Vaping Use   Vaping Use: Never used  Substance and Sexual Activity   Alcohol use: No   Drug use: No   Sexual activity:  Not on file  Other Topics Concern   Not on file  Social History Narrative   Not on file   Social Determinants of Health   Financial Resource Strain: Not on file  Food Insecurity: Not on file  Transportation Needs: Not on file  Physical Activity: Not on file  Stress: Not on file  Social Connections: Not on file  Intimate Partner Violence: Not on file  She works as a Presenter, 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   Allergies: Allergies  Allergen Reactions   Keflex [Cephalexin] Itching   Current Medications: Current Outpatient Medications  Medication Sig Dispense Refill   dicyclomine (BENTYL) 10 MG capsule Take 10 mg by mouth 4 (four) times daily -  before meals and at bedtime.     ferrous sulfate 325 (65 FE) MG EC tablet Take by mouth.     ondansetron (ZOFRAN) 4 MG tablet Take 1 tablet (4 mg total) by mouth every 8 (eight) hours as needed for nausea or vomiting. 20 tablet 0   albuterol (VENTOLIN HFA) 108 (90 Base) MCG/ACT inhaler Inhale 1-2 puffs into the lungs every 6 (six) hours as needed for wheezing or shortness of breath. (Patient not taking: Reported on 03/03/2021) 1 each 0   amoxicillin-clavulanate (AUGMENTIN) 875-125 MG tablet Take 1 tablet by mouth 2 (two) times daily. (Patient not taking: Reported on 03/03/2021) 20 tablet 0   No current facility-administered medications for this visit.   Gynecologic History:  Menarche: age 41 Irregular Pain with menses Sexually active G0P0  Review of Systems:  General: negative for fevers, changes in weight or night sweats Skin: negative for changes in moles or sores or rash Eyes: negative for changes in vision HEENT: negative for change in hearing, tinnitus, voice changes Pulmonary: negative for orthopnea, productive cough, wheezingpositive for shortness of breath Cardiac: negative for palpitations, pain Gastrointestinal: negative for nausea, vomiting, constipation, diarrhea, hematemesis,  hematochezia Genitourinary/Sexual: negative for dysuria, retention, hematuria, incontinence Ob/Gyn:  negative for abnormal bleeding, or pain Musculoskeletal: negative for pain, joint pain positive for back pain Hematology: negative for easy bruising, abnormal bleeding Neurologic/Psych: negative for headaches, seizures, paralysis, weakness, numbness  Objective:  Physical Examination:  BP (!) 147/83 (Patient Position: Sitting)   Pulse 90   Temp 97.8 F (36.6 C)   Resp 17   Wt (!) 351 lb (159.2 kg)   SpO2 97%   BMI 53.37 kg/m    ECOG Performance Status: 1 - Symptomatic but completely ambulatory  General appearance: alert, cooperative, and appears stated age HEENT:PERRLA Lymph node survey: non-palpable, axillary, inguinal, supraclavicular Cardiovascular: regular rate and rhythm Respiratory: normal air entry, lungs clear to auscultation Abdomen: soft, protuberant, no hernias, and well healed incision Back: inspection of back is normal Extremities:  chronic lymphedema, bandage on lower left shin.  No cellulitis in surrounding skin. Neurological exam reveals alert, oriented, normal speech, no focal findings or movement disorder noted.  Pelvic Exam: Chaperoned by NP EGBUS/Vag: normal Cx: nullipaous Uterus: normal  Bimanural/RV: no masses  Labs Lab Results  Component Value Date   WBC 7.8 03/03/2021   HGB 9.9 (L) 03/03/2021   HCT 34.3 (L) 03/03/2021   MCV 83.7 03/03/2021   PLT 233 03/03/2021     Chemistry      Component Value Date/Time   NA 137 03/03/2021 1534   NA 140 11/20/2013 1111   K 4.5 03/03/2021 1534   K 3.9 11/20/2013 1111   CL 101 03/03/2021 1534   CL 102 11/20/2013 1111   CO2 28 03/03/2021 1534   CO2 31 11/20/2013 1111   BUN 11 03/03/2021 1534   BUN 11 11/20/2013 1111   CREATININE 0.75 03/03/2021 1534   CREATININE 0.78 11/20/2013 1111      Component Value Date/Time   CALCIUM 8.9 03/03/2021 1534   CALCIUM 9.2 11/20/2013 1111   ALKPHOS 84 03/03/2021  1534   ALKPHOS 92 11/20/2013 1111   AST 18 03/03/2021 1534   AST 22 11/20/2013 1111   ALT 15 03/03/2021 1534   ALT 24 11/20/2013 1111   BILITOT <0.1 (L) 03/03/2021 1534   BILITOT 0.5 11/20/2013 1111            Assessment:  Teresa Cameron is a 50 y.o. G57 female diagnosed with menorrhagia and anemia requiring blood transfusion and grade 1 endometrial cancer.   Medical co-morbidities complicating care: morbid obesity BMI 53.  Plan:   Problem List Items Addressed This Visit       Genitourinary   CIN I (cervical intraepithelial neoplasia I)   Vulvar intraepithelial neoplasia (VIN)   Taking iron orally for anemia.  We discussed options for management of endometrial cancer including surgery since she is not interested in fertility and has had severe anemia due to bleeding.  She is scheduled for TLH/BSO, SLN mapping and biopsies, possible pelvic/PA node biopsies, possible X lap on 03/17/21 with Drs. Infantof Villagomez and [employer].   The risks of surgery were discussed in detail and she understands these to  include infection; wound separation; hernia; vaginal cuff separation, injury to adjacent organs such as bowel, bladder, blood vessels, ureters and nerves; bleeding which may require blood transfusion; anesthesia risk; thromboembolic events; possible death; unforeseen complications; possible need for re-exploration; medical complications such as heart attack, stroke, pleural effusion and pneumonia; and, if staging performed the risk of lymphedema and lymphocyst.  The patient will receive DVT and antibiotic prophylaxis as indicated.  She voiced a clear understanding.  She had the opportunity to ask questions and written informed consent was obtained today.    The patient's diagnosis, an outline of the further diagnostic and laboratory studies which will be required, the recommendation, and alternatives were discussed.  All questions were answered to the patient's satisfaction.  A total of 60  minutes were spent with the patient/family today; 50% was spent in education, counseling and coordination of care for endometrial cancer.    Mellody Drown, MD  CC:  Schermerhorn, Gwen Her, MD [address] [facility] [place],  [place] [address] [phone]

## 2021-03-03 NOTE — Progress Notes (Signed)
Patient here for oncology follow-up appointment,  concerns of back pain and SOB

## 2021-03-03 NOTE — Progress Notes (Signed)
Gynecologic Oncology Consult Visit   Referring Provider: Dr. Ouida Sills  Chief Concern: grade 1 endometrial cancer  Subjective:  Teresa Cameron is a 50 y.o. G37 female who is seen in consultation from Dr. Ouida Sills for grade 1 endometrial cancer  She has a long h/o irregular bleeding, bleeds q other month and at time extremely heavy clots . She has required a blood transfusion at [facility] 8/22. 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  H/h : 9/31 Ferritin =5  Family History: family history includes Heart disease in her maternal grandfather, mother, and paternal uncle; High blood pressure (Hypertension) in her father; Menstrual problems (age of onset: 65) in her mother; Myocardial Infarction (Heart attack) in her maternal grandfather and mother; No Known Problems in her brother.  Problem List: Patient Active Problem List   Diagnosis Date Noted   Endometrial cancer ([place]) 03/03/2021   Anemia due to chronic blood loss 03/03/2021   GERD (gastroesophageal reflux disease) 03/03/2021   Past Medical History: Past Medical History:  Diagnosis Date   Asthma    Renal disorder    Past Surgical History: Past Surgical History:  Procedure Laterality Date   CHOLECYSTECTOMY     Family History: Family History  Problem Relation Age of Onset   Heart failure Mother    Heart failure Father    Social History: Social History   Socioeconomic History   Marital status: Married    Spouse name: Not on file   Number of children: Not on file   Years of education: Not on file   Highest education level: Not on file  Occupational History   Not on file  Tobacco Use   Smoking status: Never   Smokeless tobacco: Never  Vaping Use   Vaping Use: Never used  Substance and Sexual Activity   Alcohol use: No   Drug use: No   Sexual activity:  Not on file  Other Topics Concern   Not on file  Social History Narrative   Not on file   Social Determinants of Health   Financial Resource Strain: Not on file  Food Insecurity: Not on file  Transportation Needs: Not on file  Physical Activity: Not on file  Stress: Not on file  Social Connections: Not on file  Intimate Partner Violence: Not on file  She works as a Presenter, broadcasting. Her wife is Jonetta Speak who accompanies her today.   Allergies: Allergies  Allergen Reactions   Keflex [Cephalexin] Itching   Current Medications: Current Outpatient Medications  Medication Sig Dispense Refill   dicyclomine (BENTYL) 10 MG capsule Take 10 mg by mouth 4 (four) times daily -  before meals and at bedtime.     ferrous sulfate 325 (65 FE) MG EC tablet Take by mouth.     ondansetron (ZOFRAN) 4 MG tablet Take 1 tablet (4 mg total) by mouth every 8 (eight) hours as needed for nausea or vomiting. 20 tablet 0   albuterol (VENTOLIN HFA) 108 (90 Base) MCG/ACT inhaler Inhale 1-2 puffs into the lungs every 6 (six) hours as needed for wheezing or shortness of breath. (Patient not taking: Reported on 03/03/2021) 1 each 0   amoxicillin-clavulanate (AUGMENTIN) 875-125 MG tablet Take 1 tablet by mouth 2 (two) times daily. (Patient not taking: Reported on 03/03/2021) 20 tablet 0   No current facility-administered medications for this visit.   Gynecologic History:  Menarche: age 50 Irregular Pain with menses Sexually active G0P0  Review of Systems:  General: negative for fevers, changes in weight or night sweats Skin: negative for changes in moles or sores or rash Eyes: negative for changes in vision HEENT: negative for change in hearing, tinnitus, voice changes Pulmonary: negative for orthopnea, productive cough, wheezingpositive for shortness of breath Cardiac: negative for palpitations, pain Gastrointestinal: negative for nausea, vomiting, constipation, diarrhea, hematemesis,  hematochezia Genitourinary/Sexual: negative for dysuria, retention, hematuria, incontinence Ob/Gyn:  negative for abnormal bleeding, or pain Musculoskeletal: negative for pain, joint pain positive for back pain Hematology: negative for easy bruising, abnormal bleeding Neurologic/Psych: negative for headaches, seizures, paralysis, weakness, numbness  Objective:  Physical Examination:  BP (!) 147/83 (Patient Position: Sitting)   Pulse 90   Temp 97.8 F (36.6 C)   Resp 17   Wt (!) 351 lb (159.2 kg)   SpO2 97%   BMI 53.37 kg/m    ECOG Performance Status: 1 - Symptomatic but completely ambulatory  General appearance: alert, cooperative, and appears stated age HEENT:PERRLA Lymph node survey: non-palpable, axillary, inguinal, supraclavicular Cardiovascular: regular rate and rhythm Respiratory: normal air entry, lungs clear to auscultation Abdomen: soft, protuberant, no hernias, and well healed incision Back: inspection of back is normal Extremities:  chronic lymphedema, bandage on lower left shin.  No cellulitis in surrounding skin. Neurological exam reveals alert, oriented, normal speech, no focal findings or movement disorder noted.  Pelvic Exam: Chaperoned by NP EGBUS/Vag: normal Cx: nullipaous Uterus: normal  Bimanural/RV: no masses  Labs Lab Results  Component Value Date   WBC 7.8 03/03/2021   HGB 9.9 (L) 03/03/2021   HCT 34.3 (L) 03/03/2021   MCV 83.7 03/03/2021   PLT 233 03/03/2021     Chemistry      Component Value Date/Time   NA 137 03/03/2021 1534   NA 140 11/20/2013 1111   K 4.5 03/03/2021 1534   K 3.9 11/20/2013 1111   CL 101 03/03/2021 1534   CL 102 11/20/2013 1111   CO2 28 03/03/2021 1534   CO2 31 11/20/2013 1111   BUN 11 03/03/2021 1534   BUN 11 11/20/2013 1111   CREATININE 0.75 03/03/2021 1534   CREATININE 0.78 11/20/2013 1111      Component Value Date/Time   CALCIUM 8.9 03/03/2021 1534   CALCIUM 9.2 11/20/2013 1111   ALKPHOS 84 03/03/2021  1534   ALKPHOS 92 11/20/2013 1111   AST 18 03/03/2021 1534   AST 22 11/20/2013 1111   ALT 15 03/03/2021 1534   ALT 24 11/20/2013 1111   BILITOT <0.1 (L) 03/03/2021 1534   BILITOT 0.5 11/20/2013 1111            Assessment:  Teresa Cameron is a 50 y.o. G68 female diagnosed with menorrhagia and anemia requiring blood transfusion and grade 1 endometrial cancer.   Medical co-morbidities complicating care: morbid obesity BMI 53.  Plan:   Problem List Items Addressed This Visit       Genitourinary   CIN I (cervical intraepithelial neoplasia I)   Vulvar intraepithelial neoplasia (VIN)   Taking iron orally for anemia.  We discussed options for management of endometrial cancer including surgery since she is not interested in fertility and has had severe anemia due to bleeding.  She is scheduled for TLH/BSO, SLN mapping and biopsies, possible pelvic/PA node biopsies, possible X lap on 03/17/21 with Drs. Nicolemarie Wooley and [employer].   The risks of surgery were discussed in detail and she understands these to  include infection; wound separation; hernia; vaginal cuff separation, injury to adjacent organs such as bowel, bladder, blood vessels, ureters and nerves; bleeding which may require blood transfusion; anesthesia risk; thromboembolic events; possible death; unforeseen complications; possible need for re-exploration; medical complications such as heart attack, stroke, pleural effusion and pneumonia; and, if staging performed the risk of lymphedema and lymphocyst.  The patient will receive DVT and antibiotic prophylaxis as indicated.  She voiced a clear understanding.  She had the opportunity to ask questions and written informed consent was obtained today.    The patient's diagnosis, an outline of the further diagnostic and laboratory studies which will be required, the recommendation, and alternatives were discussed.  All questions were answered to the patient's satisfaction.  A total of 60  minutes were spent with the patient/family today; 50% was spent in education, counseling and coordination of care for endometrial cancer.    Mellody Drown, MD  CC:  Schermerhorn, Gwen Her, MD [address] [facility] [place],  [place] [address] [phone]

## 2021-03-08 ENCOUNTER — Other Ambulatory Visit: Payer: Self-pay | Admitting: Nurse Practitioner

## 2021-03-08 DIAGNOSIS — D500 Iron deficiency anemia secondary to blood loss (chronic): Secondary | ICD-10-CM | POA: Insufficient documentation

## 2021-03-09 ENCOUNTER — Telehealth: Payer: Self-pay | Admitting: *Deleted

## 2021-03-09 ENCOUNTER — Telehealth: Payer: Self-pay

## 2021-03-09 NOTE — Telephone Encounter (Signed)
Per [id number] No Auth required by this plan for [id number] ref# [id number]

## 2021-03-10 ENCOUNTER — Telehealth: Payer: Self-pay | Admitting: Nurse Practitioner

## 2021-03-10 NOTE — Telephone Encounter (Signed)
Attempt made to reach patient to make her aware of virtual appointment made tomorrow with [name]. Patient needs to see a provider prior to her iron infusion on 12/1 in the afternoon.

## 2021-03-10 NOTE — Progress Notes (Signed)
Per Woodfin Ganja ok to see Ander Purpura, will pass on to schedulers to get scheduled

## 2021-03-10 NOTE — Telephone Encounter (Signed)
Fmla forms/disability forms x 3 completed on behalf of patient/ pt's spouse- Jonetta Speak. Pending Lauren, NPs' signature. Will fax to agency managing the fmlas.

## 2021-03-11 ENCOUNTER — Inpatient Hospital Stay: Payer: BC Managed Care – PPO | Admitting: Nurse Practitioner

## 2021-03-11 ENCOUNTER — Encounter: Payer: Self-pay | Admitting: Nurse Practitioner

## 2021-03-11 ENCOUNTER — Other Ambulatory Visit: Payer: Self-pay

## 2021-03-11 ENCOUNTER — Inpatient Hospital Stay: Payer: BC Managed Care – PPO | Attending: Nurse Practitioner

## 2021-03-11 VITALS — BP 128/76 | HR 54 | Temp 98.6°F | Resp 17

## 2021-03-11 DIAGNOSIS — D500 Iron deficiency anemia secondary to blood loss (chronic): Secondary | ICD-10-CM

## 2021-03-11 DIAGNOSIS — N939 Abnormal uterine and vaginal bleeding, unspecified: Secondary | ICD-10-CM

## 2021-03-11 DIAGNOSIS — C541 Malignant neoplasm of endometrium: Secondary | ICD-10-CM | POA: Diagnosis not present

## 2021-03-11 MED ORDER — SODIUM CHLORIDE 0.9 % IV SOLN: 200.0000 mg | Freq: Once | INTRAVENOUS | Status: DC

## 2021-03-11 MED ORDER — IRON SUCROSE 20 MG/ML IV SOLN
200.0000 mg | Freq: Once | INTRAVENOUS | Status: AC
  Administered 2021-03-11: 200 mg via INTRAVENOUS
  Filled 2021-03-11: qty 10

## 2021-03-11 MED ORDER — SODIUM CHLORIDE 0.9 % IV SOLN
Freq: Once | INTRAVENOUS | Status: AC
  Filled 2021-03-11: qty 250

## 2021-03-11 NOTE — Telephone Encounter (Signed)
Per Teresa Cameron, patient does not know the direct fax # for her FMLA. Pt would like to pick up her FMLA today at her iron infusion apt and she will personally deliver her FMLA papers to her employer.

## 2021-03-11 NOTE — Progress Notes (Signed)
[place] [facility] [place]. [facility] [address] [place], [place] [address] [phone] (phone) [phone] (fax)  Virtual Visit Progress Note  I connected with Teresa Cameron on 03/18/21 at  9:00 AM EST by video enabled telemedicine visit and verified that I am speaking with the correct person using two identifiers.   I discussed the limitations, risks, security and privacy concerns of performing an evaluation and management service by telemedicine and the availability of in-person appointments. I also discussed with the patient that there may be a patient responsible charge related to this service. The patient expressed understanding and agreed to proceed.   Other persons participating in the visit and their role in the encounter: Anderson Malta, patien's spouse   Patient's location: home  Provider's location: clinic   Chief Complaint: Iron Deficiency Anemia d/t chronic blood loss  Clinic Day:  03/11/2021  Referring physician: Dr. Fransisca Connors  History of Presenting Illness: Patient is 50 year old female seen in consultation from Dr. Fransisca Connors for grade 1 endometrial cancer.  She has a long history of irregular bleeding at times is described as heavy.  She previously required a blood transfusion in August 2022 for hemoglobin of 6 secondary to vaginal bleeding.  She has planned TLH BSO on 03/17/2021 with Dr. Fransisca Connors.  She was referred to hematology for evaluation and management of anemia and probable iron deficiency due to chronic blood loss.  She has associated fatigue and leg cramping.  Shortness of breath with exertion.  She has not tried oral iron.  Continues to have bleeding.  Review of Systems  Constitutional:  Positive for fatigue. Negative for appetite change and unexpected weight change.  HENT:   Negative for mouth sores, sore throat and trouble swallowing.   Respiratory:  Positive for shortness of  breath. Negative for chest tightness.   Cardiovascular:  Negative for leg swelling.  Gastrointestinal:  Negative for abdominal pain, constipation, diarrhea, nausea and vomiting.  Genitourinary:  Positive for vaginal bleeding. Negative for bladder incontinence and dysuria.   Musculoskeletal:  Negative for flank pain and neck stiffness.  Skin:  Negative for itching, rash and wound.  Neurological:  Negative for dizziness, headaches, light-headedness and numbness.  Hematological:  Negative for adenopathy. Does not bruise/bleed easily.  Psychiatric/Behavioral:  Negative for confusion, depression and sleep disturbance. The patient is not nervous/anxious.     Past Medical History:  Diagnosis Date   Asthma    Renal disorder     Past Surgical History:  Procedure Laterality Date   CHOLECYSTECTOMY      Family History  Problem Relation Age of Onset   Heart failure Mother    Heart failure Father      reports that she has never smoked. She has never used smokeless tobacco. She reports that she does not drink alcohol and does not use drugs.  Allergies  Allergen Reactions   Keflex [Cephalexin] Itching    Current Outpatient Medications  Medication Sig Dispense Refill   albuterol (VENTOLIN HFA) 108 (90 Base) MCG/ACT inhaler Inhale 1-2 puffs into the lungs every 6 (six) hours as needed for wheezing or shortness of breath. (Patient not taking: Reported on 03/03/2021) 1 each 0   dicyclomine (BENTYL) 10 MG capsule Take 10 mg by mouth daily.     ferrous sulfate 325 (65 FE) MG EC tablet Take 325 mg by mouth 2 (two) times daily.     Multiple Vitamins-Minerals (MULTIVITAMIN WITH MINERALS) tablet Take 1 tablet  by mouth daily. [place] with Iron     No current facility-administered medications for this visit.    Objective:  There were no vitals taken for this visit.  Wt Readings from Last 3 Encounters:  03/03/21 (!) 351 lb (159.2 kg)  11/13/20 (!) 359 lb 12.7 oz (163.2 kg)  11/13/20 (!) 360 lb  (163.3 kg)    There is no height or weight on file to calculate BMI.  Performance status (ECOG): 1 - Symptomatic but completely ambulatory Physical Exam Constitutional:      Appearance: She is not ill-appearing.  Pulmonary:     Effort: No respiratory distress.  Skin:    Coloration: Skin is not pale.  Neurological:     Mental Status: She is alert and oriented to person, place, and time.  Psychiatric:        Mood and Affect: Mood normal.        Behavior: Behavior normal.    CBC Latest Ref Rng & Units 03/03/2021 11/13/2020 11/13/2020  WBC 4.0 - 10.5 K/uL 7.8 6.6 6.1  Hemoglobin 12.0 - 15.0 g/dL 9.9(L) 6.1(L) 6.0(L)  Hematocrit 36.0 - 46.0 % 34.3(L) 21.6(L) 20.9(L)  Platelets 150 - 400 K/uL 233 224 238   CMP Latest Ref Rng & Units 03/03/2021 11/13/2020 11/13/2020  Glucose 70 - 99 mg/dL 100(H) 101(H) 102(H)  BUN 6 - 20 mg/dL 11 13 14   Creatinine 0.44 - 1.00 mg/dL 0.75 0.72 0.76  Sodium 135 - 145 mmol/L 137 137 134(L)  Potassium 3.5 - 5.1 mmol/L 4.5 3.7 3.5  Chloride 98 - 111 mmol/L 101 105 103  CO2 22 - 32 mmol/L 28 24 25   Calcium 8.9 - 10.3 mg/dL 8.9 8.8(L) 8.4(L)  Total Protein 6.5 - 8.1 g/dL 8.3(H) 7.3 7.4  Total Bilirubin 0.3 - 1.2 mg/dL <0.1(L) 0.7 0.4  Alkaline Phos 38 - 126 U/L 84 75 81  AST 15 - 41 U/L 18 19 18   ALT 0 - 44 U/L 15 12 11    No results found for: CEA1 / No results found for: CEA1 No results found for: PSA1 No results found for: IOE703 No results found for: CAN125  No results found for: TOTALPROTELP, ALBUMINELP, A1GS, A2GS, BETS, BETA2SER, GAMS, MSPIKE, SPEI Lab Results  Component Value Date   TIBC 602 (H) 03/03/2021   FERRITIN 4 (L) 03/03/2021   IRONPCTSAT 7 (L) 03/03/2021   No results found for: LDH  No results found.    Assessment & Plan:  Teresa Cameron is a 50 y.o. female diagnosed with grade 1 endometrial cancer currently awaiting definitive surgery who was referred to hematology for anemia.  She has previously required blood transfusion  for hemoglobin of 6.  Labs today consistent with iron deficiency anemia in the setting of ongoing heavy vaginal bleeding.  Hemoglobin 9.9, ferritin 4, iron saturation 7% with elevated TIBC.  Recommend IV iron to rapidly replace iron stores and optimize hemoglobin in anticipation of surgery for known endometrial cancer.  Risk versus benefits and alternatives reviewed.  Patient agrees to proceed with IV iron.  Proceed with venofer x 2 as scheduled. She has surgery planned for 03/17/21 with post-op visit on 04/14/21. I will recheck her labs at that time. If she has persistent iron deficiency at that time, we can consider additional IV iron. Otherwise, follow up prn.    I discussed the assessment and treatment plan with the patient. The patient was provided an opportunity to ask questions and all were answered. The patient agreed with the plan  and demonstrated an understanding of the instructions.   The patient was advised to call back or seek an in-person evaluation if the symptoms worsen or if the condition fails to improve as anticipated.   I spent 25 minutes face-to-face video visit time dedicated to the care of this patient on the date of this encounter to include pre-visit review of gyn-onc notes, labs, face-to-face time with the patient, and post visit ordering of testing/documentation.    [name], DNP, AGNP-C [place] [facility] [place]. [facility] [phone] (clinic)

## 2021-03-11 NOTE — Patient Instructions (Signed)

## 2021-03-12 ENCOUNTER — Other Ambulatory Visit: Payer: Self-pay

## 2021-03-12 ENCOUNTER — Encounter
Admission: RE | Admit: 2021-03-12 | Discharge: 2021-03-12 | Disposition: A | Discharge status: Home / Self Care | Payer: BC Managed Care – PPO | Source: Ambulatory Visit | Attending: Obstetrics and Gynecology | Admitting: Obstetrics and Gynecology

## 2021-03-12 HISTORY — DX: Pneumonia, unspecified organism: J18.9

## 2021-03-12 HISTORY — DX: Anxiety disorder, unspecified: F41.9

## 2021-03-12 HISTORY — DX: Anemia, unspecified: D64.9

## 2021-03-12 HISTORY — DX: Malignant neoplasm of endometrium: C54.1

## 2021-03-12 HISTORY — DX: Personal history of urinary calculi: Z87.442

## 2021-03-12 HISTORY — DX: Gastro-esophageal reflux disease without esophagitis: K21.9

## 2021-03-12 NOTE — Patient Instructions (Signed)
Your procedure is scheduled on:03-17-21 Wednesday Report to the Registration Desk on the 1st floor of the [facility].Then proceed to the 2nd floor Surgery Desk in the [facility] To find out your arrival time, please call [phone] between 1PM - 3PM on:03-16-21 Tuesday  REMEMBER: Instructions that are not followed completely may result in serious medical risk, up to and including death; or upon the discretion of your surgeon and anesthesiologist your surgery may need to be rescheduled.  Do not eat food after midnight the night before surgery.  No gum chewing, lozengers or hard candies.  You may however, drink CLEAR liquids up to 2 hours before you are scheduled to arrive for your surgery. Do not drink anything within 2 hours of your scheduled arrival time.  Clear liquids include: - water  - apple juice without pulp - gatorade (not RED, PURPLE, OR BLUE) - black coffee or tea (Do NOT add milk or creamers to the coffee or tea) Do NOT drink anything that is not on this list.  TAKE THESE MEDICATIONS THE MORNING OF SURGERY WITH A SIP OF WATER: -omeprazole (PRILOSEC OTC) 20 MG tablet-take one the night before and one on the morning of surgery - helps to prevent nausea after surgery.)  One week prior to surgery: Stop Anti-inflammatories (NSAIDS) such as Advil, Aleve, Ibuprofen, Motrin, Naproxen, Naprosyn and Aspirin based products such as Excedrin, Goodys Powder, BC Powder.You may however, continue to take Tylenol if needed for pain up until the day of surgery.  Stop ANY OVER THE COUNTER supplements until after surgery-Continue your Multiple Vitamins-Minerals (MULTIVITAMIN WITH MINERALS) tablet and ferrous sulfate 325 (65 FE) MG EC tablet up until the day prior to surgery  No Alcohol for 24 hours before or after surgery.  No Smoking including e-cigarettes for 24 hours prior to surgery.  No chewable tobacco products for at least 6 hours prior to surgery.  No nicotine patches on the day of  surgery.  Do not use any "recreational" drugs for at least a week prior to your surgery.  Please be advised that the combination of cocaine and anesthesia may have negative outcomes, up to and including death. If you test positive for cocaine, your surgery will be cancelled.  On the morning of surgery brush your teeth with toothpaste and water, you may rinse your mouth with mouthwash if you wish. Do not swallow any toothpaste or mouthwash.  Use CHG Soap as indicated on instruction sheet.  Do not wear jewelry, make-up, hairpins, clips or nail polish.  Do not wear lotions, powders, or perfumes.   Do not shave body from the neck down 48 hours prior to surgery just in case you cut yourself which could leave a site for infection.  Also, freshly shaved skin may become irritated if using the CHG soap.  Contact lenses, hearing aids and dentures may not be worn into surgery.  Do not bring valuables to the hospital. [facility] is not responsible for any missing/lost belongings or valuables.   Notify your doctor if there is any change in your medical condition (cold, fever, infection).  Wear comfortable clothing (specific to your surgery type) to the hospital.  After surgery, you can help prevent lung complications by doing breathing exercises.  Take deep breaths and cough every 1-2 hours. Your doctor may order a device called an Incentive Spirometer to help you take deep breaths. When coughing or sneezing, hold a pillow firmly against your incision with both hands. This is called "splinting." Doing this  helps protect your incision. It also decreases belly discomfort.  If you are being admitted to the hospital overnight, leave your suitcase in the car. After surgery it may be brought to your room.  If you are being discharged the day of surgery, you will not be allowed to drive home. You will need a responsible adult (18 years or older) to drive you home and stay with you that night.   If  you are taking public transportation, you will need to have a responsible adult (18 years or older) with you. Please confirm with your physician that it is acceptable to use public transportation.   Please call the [facility] at [phone] if you have any questions about these instructions.  Surgery Visitation Policy:  Patients undergoing a surgery or procedure may have one family member or support person with them as long as that person is not COVID-19 positive or experiencing its symptoms.  That person may remain in the waiting area during the procedure and may rotate out with other people.  Inpatient Visitation:    Visiting hours are 7 a.m. to 8 p.m. Up to two visitors ages 16+ are allowed at one time in a patient room. The visitors may rotate out with other people during the day. Visitors must check out when they leave, or other visitors will not be allowed. One designated support person may remain overnight. The visitor must pass COVID-19 screenings, use hand sanitizer when entering and exiting the patient's room and wear a mask at all times, including in the patient's room. Patients must also wear a mask when staff or their visitor are in the room. Masking is required regardless of vaccination status.

## 2021-03-15 ENCOUNTER — Other Ambulatory Visit: Payer: Self-pay

## 2021-03-15 ENCOUNTER — Encounter
Admission: RE | Admit: 2021-03-15 | Discharge: 2021-03-15 | Disposition: A | Discharge status: Home / Self Care | Payer: BC Managed Care – PPO | Source: Ambulatory Visit | Attending: Obstetrics and Gynecology | Admitting: Obstetrics and Gynecology

## 2021-03-15 DIAGNOSIS — N838 Other noninflammatory disorders of ovary, fallopian tube and broad ligament: Secondary | ICD-10-CM | POA: Diagnosis not present

## 2021-03-15 DIAGNOSIS — F419 Anxiety disorder, unspecified: Secondary | ICD-10-CM | POA: Diagnosis not present

## 2021-03-15 DIAGNOSIS — Z01812 Encounter for preprocedural laboratory examination: Secondary | ICD-10-CM | POA: Insufficient documentation

## 2021-03-15 DIAGNOSIS — N920 Excessive and frequent menstruation with regular cycle: Secondary | ICD-10-CM | POA: Diagnosis not present

## 2021-03-15 DIAGNOSIS — J45909 Unspecified asthma, uncomplicated: Secondary | ICD-10-CM | POA: Diagnosis not present

## 2021-03-15 DIAGNOSIS — C541 Malignant neoplasm of endometrium: Secondary | ICD-10-CM

## 2021-03-15 DIAGNOSIS — Z6841 Body Mass Index (BMI) 40.0 and over, adult: Secondary | ICD-10-CM | POA: Diagnosis not present

## 2021-03-15 DIAGNOSIS — D500 Iron deficiency anemia secondary to blood loss (chronic): Secondary | ICD-10-CM | POA: Diagnosis not present

## 2021-03-15 DIAGNOSIS — K219 Gastro-esophageal reflux disease without esophagitis: Secondary | ICD-10-CM | POA: Diagnosis not present

## 2021-03-15 LAB — CBC WITH DIFFERENTIAL/PLATELET
Abs Immature Granulocytes: 0.04 10*3/uL (ref 0.00–0.07)
Basophils Absolute: 0 10*3/uL (ref 0.0–0.1)
Basophils Relative: 1 %
Eosinophils Absolute: 0.2 10*3/uL (ref 0.0–0.5)
Eosinophils Relative: 3 %
HCT: 39.1 % (ref 36.0–46.0)
Hemoglobin: 11.3 g/dL — ABNORMAL LOW (ref 12.0–15.0)
Immature Granulocytes: 1 %
Lymphocytes Relative: 18 %
Lymphs Abs: 1.4 10*3/uL (ref 0.7–4.0)
MCH: 24.9 pg — ABNORMAL LOW (ref 26.0–34.0)
MCHC: 28.9 g/dL — ABNORMAL LOW (ref 30.0–36.0)
MCV: 86.1 fL (ref 80.0–100.0)
Monocytes Absolute: 0.7 10*3/uL (ref 0.1–1.0)
Monocytes Relative: 9 %
Neutro Abs: 5.2 10*3/uL (ref 1.7–7.7)
Neutrophils Relative %: 68 %
Platelets: 291 10*3/uL (ref 150–400)
RBC: 4.54 MIL/uL (ref 3.87–5.11)
RDW: 20.2 % — ABNORMAL HIGH (ref 11.5–15.5)
WBC: 7.6 10*3/uL (ref 4.0–10.5)
nRBC: 0 % (ref 0.0–0.2)

## 2021-03-15 LAB — URINALYSIS, ROUTINE W REFLEX MICROSCOPIC
Bilirubin Urine: NEGATIVE
Glucose, UA: NEGATIVE mg/dL
Ketones, ur: NEGATIVE mg/dL
Leukocytes,Ua: NEGATIVE
Nitrite: NEGATIVE
Protein, ur: NEGATIVE mg/dL
Specific Gravity, Urine: <1.005 — ABNORMAL LOW (ref 1.005–1.030)
pH: 6 (ref 5.0–8.0)

## 2021-03-16 ENCOUNTER — Inpatient Hospital Stay: Payer: BC Managed Care – PPO

## 2021-03-16 VITALS — BP 136/81 | HR 72 | Temp 98.9°F | Resp 20

## 2021-03-16 DIAGNOSIS — C541 Malignant neoplasm of endometrium: Secondary | ICD-10-CM | POA: Diagnosis not present

## 2021-03-16 DIAGNOSIS — D500 Iron deficiency anemia secondary to blood loss (chronic): Secondary | ICD-10-CM

## 2021-03-16 MED ORDER — SODIUM CHLORIDE 0.9 % IV SOLN: 200.0000 mg | Freq: Once | INTRAVENOUS | Status: DC

## 2021-03-16 MED ORDER — SODIUM CHLORIDE 0.9 % IV SOLN
Freq: Once | INTRAVENOUS | Status: AC
  Filled 2021-03-16: qty 250

## 2021-03-16 MED ORDER — IRON SUCROSE 20 MG/ML IV SOLN
200.0000 mg | Freq: Once | INTRAVENOUS | Status: AC
  Administered 2021-03-16: 200 mg via INTRAVENOUS
  Filled 2021-03-16: qty 10

## 2021-03-17 ENCOUNTER — Ambulatory Visit
Admission: RE | Admit: 2021-03-17 | Discharge: 2021-03-17 | Disposition: A | Discharge status: Home / Self Care | Payer: BC Managed Care – PPO | Attending: Obstetrics and Gynecology | Admitting: Obstetrics and Gynecology

## 2021-03-17 ENCOUNTER — Encounter
Admission: RE | Disposition: A | Discharge status: Home / Self Care | Payer: Self-pay | Source: Home / Self Care | Attending: Obstetrics and Gynecology

## 2021-03-17 ENCOUNTER — Telehealth: Payer: Self-pay | Admitting: *Deleted

## 2021-03-17 ENCOUNTER — Ambulatory Visit: Payer: BC Managed Care – PPO | Admitting: Certified Registered Nurse Anesthetist

## 2021-03-17 ENCOUNTER — Other Ambulatory Visit: Payer: Self-pay

## 2021-03-17 DIAGNOSIS — J45909 Unspecified asthma, uncomplicated: Secondary | ICD-10-CM | POA: Insufficient documentation

## 2021-03-17 DIAGNOSIS — K219 Gastro-esophageal reflux disease without esophagitis: Secondary | ICD-10-CM | POA: Insufficient documentation

## 2021-03-17 DIAGNOSIS — N920 Excessive and frequent menstruation with regular cycle: Secondary | ICD-10-CM | POA: Insufficient documentation

## 2021-03-17 DIAGNOSIS — C541 Malignant neoplasm of endometrium: Secondary | ICD-10-CM

## 2021-03-17 DIAGNOSIS — Z6841 Body Mass Index (BMI) 40.0 and over, adult: Secondary | ICD-10-CM | POA: Insufficient documentation

## 2021-03-17 DIAGNOSIS — F419 Anxiety disorder, unspecified: Secondary | ICD-10-CM | POA: Insufficient documentation

## 2021-03-17 DIAGNOSIS — D500 Iron deficiency anemia secondary to blood loss (chronic): Secondary | ICD-10-CM | POA: Insufficient documentation

## 2021-03-17 DIAGNOSIS — N838 Other noninflammatory disorders of ovary, fallopian tube and broad ligament: Secondary | ICD-10-CM | POA: Insufficient documentation

## 2021-03-17 HISTORY — PX: TOTAL LAPAROSCOPIC HYSTERECTOMY WITH BILATERAL SALPINGO OOPHORECTOMY: SHX6845

## 2021-03-17 LAB — POCT PREGNANCY, URINE: Preg Test, Ur: NEGATIVE

## 2021-03-17 SURGERY — HYSTERECTOMY, TOTAL, LAPAROSCOPIC, WITH BILATERAL SALPINGO-OOPHORECTOMY
Anesthesia: General

## 2021-03-17 MED ORDER — SUGAMMADEX SODIUM 500 MG/5ML IV SOLN
INTRAVENOUS | Status: AC
  Filled 2021-03-17: qty 5

## 2021-03-17 MED ORDER — GLYCOPYRROLATE 0.2 MG/ML IJ SOLN
INTRAMUSCULAR | Status: AC
  Filled 2021-03-17: qty 1

## 2021-03-17 MED ORDER — OXYCODONE HCL 5 MG PO TABS
ORAL_TABLET | ORAL | Status: AC
  Filled 2021-03-17: qty 1

## 2021-03-17 MED ORDER — MIDAZOLAM HCL 2 MG/2ML IJ SOLN
INTRAMUSCULAR | Status: DC | PRN
  Administered 2021-03-17: 2 mg via INTRAVENOUS

## 2021-03-17 MED ORDER — FENTANYL CITRATE (PF) 100 MCG/2ML IJ SOLN
25.0000 ug | INTRAMUSCULAR | Status: AC | PRN
  Administered 2021-03-17 (×6): 25 ug via INTRAVENOUS

## 2021-03-17 MED ORDER — DEXMEDETOMIDINE HCL IN NACL 200 MCG/50ML IV SOLN
INTRAVENOUS | Status: AC
  Filled 2021-03-17: qty 50

## 2021-03-17 MED ORDER — SUGAMMADEX SODIUM 500 MG/5ML IV SOLN
INTRAVENOUS | Status: DC | PRN
  Administered 2021-03-17: 400 mg via INTRAVENOUS

## 2021-03-17 MED ORDER — ONDANSETRON HCL 4 MG/2ML IJ SOLN
INTRAMUSCULAR | Status: DC | PRN
  Administered 2021-03-17: 4 mg via INTRAVENOUS

## 2021-03-17 MED ORDER — SUCCINYLCHOLINE CHLORIDE 200 MG/10ML IV SOSY
PREFILLED_SYRINGE | INTRAVENOUS | Status: AC
  Filled 2021-03-17: qty 10

## 2021-03-17 MED ORDER — LIDOCAINE HCL (CARDIAC) PF 100 MG/5ML IV SOSY
PREFILLED_SYRINGE | INTRAVENOUS | Status: DC | PRN
  Administered 2021-03-17: 80 mg via INTRAVENOUS

## 2021-03-17 MED ORDER — DEXAMETHASONE SODIUM PHOSPHATE 10 MG/ML IJ SOLN
INTRAMUSCULAR | Status: DC | PRN
  Administered 2021-03-17: 10 mg via INTRAVENOUS

## 2021-03-17 MED ORDER — CHLORHEXIDINE GLUCONATE 0.12 % MT SOLN
OROMUCOSAL | Status: AC
  Administered 2021-03-17: 15 mL via OROMUCOSAL
  Filled 2021-03-17: qty 15

## 2021-03-17 MED ORDER — DOCUSATE SODIUM 100 MG PO CAPS: 100.0000 mg | ORAL_CAPSULE | Freq: Two times a day (BID) | ORAL | 0 refills | Status: AC

## 2021-03-17 MED ORDER — PROPOFOL 10 MG/ML IV BOLUS
INTRAVENOUS | Status: AC
  Filled 2021-03-17: qty 20

## 2021-03-17 MED ORDER — FENTANYL CITRATE (PF) 100 MCG/2ML IJ SOLN
INTRAMUSCULAR | Status: AC
  Filled 2021-03-17: qty 2

## 2021-03-17 MED ORDER — 0.9 % SODIUM CHLORIDE (POUR BTL) OPTIME
TOPICAL | Status: DC | PRN
  Administered 2021-03-17: 100 mL

## 2021-03-17 MED ORDER — BUPIVACAINE HCL (PF) 0.5 % IJ SOLN
INTRAMUSCULAR | Status: DC | PRN
  Administered 2021-03-17: 10 mL

## 2021-03-17 MED ORDER — SODIUM CHLORIDE 0.9 % IR SOLN
Status: DC | PRN
  Administered 2021-03-17: 100 mL

## 2021-03-17 MED ORDER — ONDANSETRON HCL 4 MG/2ML IJ SOLN
INTRAMUSCULAR | Status: AC
  Filled 2021-03-17: qty 2

## 2021-03-17 MED ORDER — FENTANYL CITRATE (PF) 100 MCG/2ML IJ SOLN
INTRAMUSCULAR | Status: AC
  Administered 2021-03-17: 25 ug via INTRAVENOUS
  Filled 2021-03-17: qty 2

## 2021-03-17 MED ORDER — CHLORHEXIDINE GLUCONATE CLOTH 2 % EX PADS: 6.0000 | MEDICATED_PAD | Freq: Once | CUTANEOUS | Status: DC

## 2021-03-17 MED ORDER — METRONIDAZOLE 500 MG/100ML IV SOLN
500.0000 mg | INTRAVENOUS | Status: AC
  Administered 2021-03-17: 500 mg via INTRAVENOUS
  Filled 2021-03-17: qty 100

## 2021-03-17 MED ORDER — DEXMEDETOMIDINE (PRECEDEX) IN NS 20 MCG/5ML (4 MCG/ML) IV SYRINGE
PREFILLED_SYRINGE | INTRAVENOUS | Status: DC | PRN
  Administered 2021-03-17: 8 ug via INTRAVENOUS
  Administered 2021-03-17: 4 ug via INTRAVENOUS
  Administered 2021-03-17: 8 ug via INTRAVENOUS

## 2021-03-17 MED ORDER — SUCCINYLCHOLINE CHLORIDE 200 MG/10ML IV SOSY
PREFILLED_SYRINGE | INTRAVENOUS | Status: DC | PRN
  Administered 2021-03-17: 140 mg via INTRAVENOUS

## 2021-03-17 MED ORDER — OXYCODONE HCL 5 MG PO TABS
5.0000 mg | ORAL_TABLET | Freq: Once | ORAL | Status: AC
  Administered 2021-03-17: 5 mg via ORAL

## 2021-03-17 MED ORDER — MIDAZOLAM HCL 2 MG/2ML IJ SOLN
INTRAMUSCULAR | Status: AC
  Filled 2021-03-17: qty 2

## 2021-03-17 MED ORDER — CIPROFLOXACIN IN D5W 400 MG/200ML IV SOLN
400.0000 mg | INTRAVENOUS | Status: AC
  Administered 2021-03-17: 400 mg via INTRAVENOUS

## 2021-03-17 MED ORDER — GLYCOPYRROLATE 0.2 MG/ML IJ SOLN
INTRAMUSCULAR | Status: DC | PRN
  Administered 2021-03-17: .1 mg via INTRAVENOUS

## 2021-03-17 MED ORDER — KETOROLAC TROMETHAMINE 30 MG/ML IJ SOLN
INTRAMUSCULAR | Status: DC | PRN
  Administered 2021-03-17: 30 mg via INTRAVENOUS

## 2021-03-17 MED ORDER — ONDANSETRON HCL 4 MG/2ML IJ SOLN: 4.0000 mg | Freq: Once | INTRAMUSCULAR | Status: DC | PRN

## 2021-03-17 MED ORDER — ORAL CARE MOUTH RINSE: 15.0000 mL | Freq: Once | OROMUCOSAL | Status: AC

## 2021-03-17 MED ORDER — BUPIVACAINE HCL (PF) 0.5 % IJ SOLN
INTRAMUSCULAR | Status: AC
  Filled 2021-03-17: qty 30

## 2021-03-17 MED ORDER — FENTANYL CITRATE (PF) 100 MCG/2ML IJ SOLN
INTRAMUSCULAR | Status: DC | PRN
  Administered 2021-03-17 (×4): 50 ug via INTRAVENOUS

## 2021-03-17 MED ORDER — INDOCYANINE GREEN 25 MG IV SOLR
INTRAVENOUS | Status: DC | PRN
  Administered 2021-03-17: 10 mg

## 2021-03-17 MED ORDER — LIDOCAINE HCL (PF) 2 % IJ SOLN
INTRAMUSCULAR | Status: AC
  Filled 2021-03-17: qty 5

## 2021-03-17 MED ORDER — IBUPROFEN 800 MG PO TABS: 800.0000 mg | ORAL_TABLET | Freq: Three times a day (TID) | ORAL | 1 refills | Status: AC

## 2021-03-17 MED ORDER — HEPARIN SODIUM (PORCINE) 5000 UNIT/ML IJ SOLN
INTRAMUSCULAR | Status: AC
  Administered 2021-03-17: 7500 [IU] via SUBCUTANEOUS
  Filled 2021-03-17: qty 2

## 2021-03-17 MED ORDER — OXYCODONE HCL 5 MG PO TABS: 5.0000 mg | ORAL_TABLET | ORAL | 0 refills | Status: AC | PRN

## 2021-03-17 MED ORDER — LACTATED RINGERS IV SOLN: INTRAVENOUS | Status: DC

## 2021-03-17 MED ORDER — HEPARIN SODIUM (PORCINE) 5000 UNIT/ML IJ SOLN: 7500.0000 [IU] | Freq: Once | INTRAMUSCULAR | Status: AC

## 2021-03-17 MED ORDER — ACETAMINOPHEN 10 MG/ML IV SOLN
INTRAVENOUS | Status: DC | PRN
  Administered 2021-03-17: 1000 mg via INTRAVENOUS

## 2021-03-17 MED ORDER — GABAPENTIN 800 MG PO TABS: 800.0000 mg | ORAL_TABLET | Freq: Every day | ORAL | 0 refills | Status: AC

## 2021-03-17 MED ORDER — ROCURONIUM BROMIDE 100 MG/10ML IV SOLN
INTRAVENOUS | Status: DC | PRN
  Administered 2021-03-17 (×2): 20 mg via INTRAVENOUS
  Administered 2021-03-17: 10 mg via INTRAVENOUS
  Administered 2021-03-17: 20 mg via INTRAVENOUS
  Administered 2021-03-17: 40 mg via INTRAVENOUS

## 2021-03-17 MED ORDER — ACETAMINOPHEN 500 MG PO TABS: 1000.0000 mg | ORAL_TABLET | Freq: Four times a day (QID) | ORAL | 0 refills | Status: AC

## 2021-03-17 MED ORDER — ROCURONIUM BROMIDE 10 MG/ML (PF) SYRINGE
PREFILLED_SYRINGE | INTRAVENOUS | Status: AC
  Filled 2021-03-17: qty 20

## 2021-03-17 MED ORDER — PROPOFOL 10 MG/ML IV BOLUS
INTRAVENOUS | Status: DC | PRN
  Administered 2021-03-17: 200 mg via INTRAVENOUS

## 2021-03-17 MED ORDER — ACETAMINOPHEN 10 MG/ML IV SOLN
INTRAVENOUS | Status: AC
  Filled 2021-03-17: qty 100

## 2021-03-17 MED ORDER — CHLORHEXIDINE GLUCONATE 0.12 % MT SOLN: 15.0000 mL | Freq: Once | OROMUCOSAL | Status: AC

## 2021-03-17 MED ORDER — CIPROFLOXACIN IN D5W 400 MG/200ML IV SOLN
INTRAVENOUS | Status: AC
  Filled 2021-03-17: qty 200

## 2021-03-17 SURGICAL SUPPLY — 60 items
ADH SKN CLS APL DERMABOND .7 (GAUZE/BANDAGES/DRESSINGS) ×2
APL PRP STRL LF DISP 70% ISPRP (MISCELLANEOUS) ×6
BAG DRN RND TRDRP ANRFLXCHMBR (UROLOGICAL SUPPLIES) ×2
BAG URINE DRAIN 2000ML AR STRL (UROLOGICAL SUPPLIES) ×3 IMPLANT
BLADE SURG 15 STRL LF DISP TIS (BLADE) ×2 IMPLANT
BLADE SURG 15 STRL SS (BLADE) ×3
CANNULA DILATOR 5 W/SLV (CANNULA) ×3 IMPLANT
CATH FOLEY 2WAY  5CC 16FR (CATHETERS) ×1
CATH FOLEY 2WAY 5CC 16FR (CATHETERS) ×2
CATH URTH 16FR FL 2W BLN LF (CATHETERS) ×2 IMPLANT
CHLORAPREP W/TINT 26 (MISCELLANEOUS) ×9 IMPLANT
CNTNR SPEC 2.5X3XGRAD LEK (MISCELLANEOUS) ×2
CONT SPEC 4OZ STER OR WHT (MISCELLANEOUS) ×1
CONT SPEC 4OZ STRL OR WHT (MISCELLANEOUS) ×2
CONTAINER SPEC 2.5X3XGRAD LEK (MISCELLANEOUS) ×2 IMPLANT
CORD MONOPOLAR M/FML 12FT (MISCELLANEOUS) ×3 IMPLANT
DERMABOND ADVANCED (GAUZE/BANDAGES/DRESSINGS) ×1
DERMABOND ADVANCED .7 DNX12 (GAUZE/BANDAGES/DRESSINGS) ×2 IMPLANT
DEVICE SUTURE ENDOST 10MM (ENDOMECHANICALS) ×3 IMPLANT
DRAPE STERI POUCH LG 24X46 STR (DRAPES) ×6 IMPLANT
GAUZE 4X4 16PLY ~~LOC~~+RFID DBL (SPONGE) ×6 IMPLANT
GLOVE SURG SYN 8.0 (GLOVE) ×12 IMPLANT
GOWN STRL REUS W/ TWL LRG LVL3 (GOWN DISPOSABLE) ×2 IMPLANT
GOWN STRL REUS W/ TWL XL LVL3 (GOWN DISPOSABLE) ×6 IMPLANT
GOWN STRL REUS W/TWL LRG LVL3 (GOWN DISPOSABLE) ×3
GOWN STRL REUS W/TWL XL LVL3 (GOWN DISPOSABLE) ×9
GRASPER SUT TROCAR 14GX15 (MISCELLANEOUS) ×3 IMPLANT
IRRIG SUCT STRYKERFLOW 2 WTIP (MISCELLANEOUS) ×3
IRRIGATION SUCT STRKRFLW 2 WTP (MISCELLANEOUS) ×2 IMPLANT
IV LACTATED RINGERS 1000ML (IV SOLUTION) ×3 IMPLANT
KIT IMAGING PINPOINTPAQ (MISCELLANEOUS) ×3 IMPLANT
KIT PINK PAD W/HEAD ARE REST (MISCELLANEOUS) ×3
KIT PINK PAD W/HEAD ARM REST (MISCELLANEOUS) ×2 IMPLANT
LABEL OR SOLS (LABEL) ×3 IMPLANT
LIGASURE VESSEL 5MM BLUNT TIP (ELECTROSURGICAL) ×3 IMPLANT
MANIFOLD NEPTUNE II (INSTRUMENTS) ×3 IMPLANT
MANIPULATOR VCARE SML CRV RETR (MISCELLANEOUS) ×3 IMPLANT
NDL INSUFF ACCESS 14 VERSASTEP (NEEDLE) ×3 IMPLANT
NEEDLE SPNL 22GX5 LNG QUINC BK (NEEDLE) ×3 IMPLANT
NS IRRIG 500ML POUR BTL (IV SOLUTION) ×3 IMPLANT
OCCLUDER COLPOPNEUMO (BALLOONS) ×3 IMPLANT
PACK GYN LAPAROSCOPIC (MISCELLANEOUS) ×3 IMPLANT
PAD OB MATERNITY 4.3X12.25 (PERSONAL CARE ITEMS) ×3 IMPLANT
PAD PREP 24X41 OB/GYN DISP (PERSONAL CARE ITEMS) ×3 IMPLANT
RELOAD ENDO STITCH 2.0 (ENDOMECHANICALS) ×9
SET TUBE SMOKE EVAC HIGH FLOW (TUBING) ×3 IMPLANT
SHEARS ENDO 5MM 31CM (CUTTER) ×3 IMPLANT
SUT MNCRL 4-0 (SUTURE) ×6
SUT MNCRL 4-0 27XMFL (SUTURE) ×4
SUT RELOAD ENDO STITCH 2 48X1 (ENDOMECHANICALS) ×6
SUT VIC AB 0 CT1 36 (SUTURE) ×3 IMPLANT
SUT VICRYL 0 AB UR-6 (SUTURE) ×3 IMPLANT
SUTURE MNCRL 4-0 27XMF (SUTURE) ×4 IMPLANT
SUTURE RELOAD END STTCH 2 48X1 (ENDOMECHANICALS) ×6 IMPLANT
SYR 10ML LL (SYRINGE) ×3 IMPLANT
SYR 50ML LL SCALE MARK (SYRINGE) ×3 IMPLANT
TROCAR BLUNT TIP 12MM OMST12BT (TROCAR) ×3 IMPLANT
TROCAR VERSASTEP PLUS 12MM (TROCAR) ×3 IMPLANT
TROCAR VERSASTEP PLUS 5MM (TROCAR) ×3 IMPLANT
WATER STERILE IRR 500ML POUR (IV SOLUTION) ×3 IMPLANT

## 2021-03-17 NOTE — Transfer of Care (Signed)
Immediate Anesthesia Transfer of Care Note  Patient: Teresa Cameron  Procedure(s) Performed: TOTAL LAPAROSCOPIC HYSTERECTOMY WITH BILATERAL SALPINGO OOPHORECTOMY (Bilateral) SENTINEL LYMPH NODE INJECTION AND MAPPING LIMITED LYMPHADENECTOMY FOR STAGING  Patient Location: PACU  Anesthesia Type:General  Level of Consciousness: drowsy  Airway & Oxygen Therapy: Patient Spontanous Breathing and Patient connected to face mask oxygen  Post-op Assessment: Report given to RN and Post -op Vital signs reviewed and stable  Post vital signs: Reviewed and stable  Last Vitals:  Vitals Value Taken Time  BP 140/71 03/17/21 1218  Temp    Pulse 77 03/17/21 1221  Resp 10 03/17/21 1221  SpO2 100 % 03/17/21 1221  Vitals shown include unvalidated device data.  Last Pain:  Vitals:   03/17/21 0724  TempSrc: Oral         Complications: No notable events documented.

## 2021-03-17 NOTE — Telephone Encounter (Signed)
Lauren, NP signed the form. RN faxing the forms now to [employer]. Copy of form will be retained on pt's chart for her files.

## 2021-03-17 NOTE — Anesthesia Preprocedure Evaluation (Signed)
Anesthesia Evaluation  Patient identified by MRN, date of birth, ID band Patient awake    Reviewed: Allergy & Precautions, H&P , NPO status , Patient's Chart, lab work & pertinent test results, reviewed documented beta blocker date and time   Airway Mallampati: III  TM Distance: >3 FB Neck ROM: full    Dental  (+) Teeth Intact   Pulmonary asthma , pneumonia,    Pulmonary exam normal        Cardiovascular Exercise Tolerance: Good negative cardio ROS Normal cardiovascular exam Rhythm:regular Rate:Normal     Neuro/Psych Anxiety negative neurological ROS  negative psych ROS   GI/Hepatic Neg liver ROS, GERD  Medicated,  Endo/Other  Morbid obesity  Renal/GU Renal disease  negative genitourinary   Musculoskeletal   Abdominal   Peds  Hematology negative hematology ROS (+)   Anesthesia Other Findings Past Medical History: No date: Anemia No date: Anxiety     Comment:  h/o No date: Asthma     Comment:  well controlled-no inhalers No date: Endometrial cancer ([place]) No date: GERD (gastroesophageal reflux disease) No date: History of kidney stones No date: Pneumonia     Comment:  h/o No date: Renal disorder Past Surgical History: No date: CHOLECYSTECTOMY BMI    Body Mass Index: 52.17 kg/m     Reproductive/Obstetrics negative OB ROS                             Anesthesia Physical Anesthesia Plan  ASA: 3  Anesthesia Plan: General ETT   Post-op Pain Management:    Induction:   PONV Risk Score and Plan: 4 or greater  Airway Management Planned:   Additional Equipment:   Intra-op Plan:   Post-operative Plan:   Informed Consent: I have reviewed the patients History and Physical, chart, labs and discussed the procedure including the risks, benefits and alternatives for the proposed anesthesia with the patient or authorized representative who has indicated his/her understanding and  acceptance.     Dental Advisory Given  Plan Discussed with: CRNA  Anesthesia Plan Comments:         Anesthesia Quick Evaluation

## 2021-03-17 NOTE — Anesthesia Procedure Notes (Signed)
Procedure Name: Intubation Date/Time: 03/17/2021 8:36 AM Performed by: Tollie Eth, CRNA Pre-anesthesia Checklist: Patient identified, Patient being monitored, Timeout performed, Emergency Drugs available and Suction available Patient Re-evaluated:Patient Re-evaluated prior to induction Oxygen Delivery Method: Circle system utilized Preoxygenation: Pre-oxygenation with 100% oxygen Induction Type: IV induction Ventilation: Mask ventilation without difficulty, Two handed mask ventilation required and Oral airway inserted - appropriate to patient size Laryngoscope Size: 3 and McGraph Grade View: Grade I Tube type: Oral Tube size: 7.0 mm Number of attempts: 1 Airway Equipment and Method: Stylet Placement Confirmation: ETT inserted through vocal cords under direct vision, positive ETCO2 and breath sounds checked- equal and bilateral Secured at: 22 cm Tube secured with: Tape Dental Injury: Teeth and Oropharynx as per pre-operative assessment

## 2021-03-17 NOTE — Interval H&P Note (Signed)
History and Physical Interval Note:  03/17/2021 8:19 AM  Teresa Cameron  has presented today for surgery, with the diagnosis of Endometrial cancer.  The various methods of treatment have been discussed with the patient and family. After consideration of risks, benefits and other options for treatment, the patient has consented to  Procedure(s): TOTAL LAPAROSCOPIC HYSTERECTOMY WITH BILATERAL SALPINGO OOPHORECTOMY, POSSIBLE PELVIC/AORTIC LYMPH NODE DISSECTION, POSSIBLE LAPAROTOMY (Bilateral) SENTINEL LYMPH NODE INJECTION AND MAPPING (N/A) LIMITED LYMPHADENECTOMY FOR STAGING (N/A) as a surgical intervention.  The patient's history has been reviewed, patient examined, no change in status, stable for surgery.  I have reviewed the patient's chart and labs.  Questions were answered to the patient's satisfaction.    Discussed with anesthesia that significant bleeding is not anticipated, but in view of her high BMI conversion to laparotomy is a possibility.   Mellody Drown

## 2021-03-17 NOTE — Discharge Instructions (Addendum)
Discharge instructions after   total laparoscopic hysterectomy   For the next three days, take ibuprofen and acetaminophen on a schedule, every 8 hours. You can take them together or you can intersperse them, and take one every four hours. I also gave you gabapentin for nighttime, to help you sleep and also to control pain. Take gabapentin medicines at night for at least the next 3 nights. You also have a narcotic, oxycodone, to take as needed if the above medicines don't help.  Postop constipation is a major cause of pain. Stay well hydrated, walk as you tolerate, and take over the counter senna as well as stool softeners if you need them.    Signs and Symptoms to Report Call our office at [phone] if you have any of the following.   Fever over 100.4 degrees or higher  Severe stomach pain not relieved with pain medications  Bright red bleeding that's heavier than a period that does not slow with rest  To go the bathroom a lot (frequency), you can't hold your urine (urgency), or it hurts when you empty your bladder (urinate)  Chest pain  Shortness of breath  Pain in the calves of your legs  Severe nausea and vomiting not relieved with anti-nausea medications  Signs of infection around your wounds, such as redness, hot to touch, swelling, green/yellow drainage (like pus), bad smelling discharge  Any concerns  What You Can Expect after Surgery  You may see some pink tinged, bloody fluid and bruising around the wound. This is normal.  You may notice shoulder and neck pain. This is caused by the gas used during surgery to expand your abdomen so your surgeon could get to the uterus easier.  You may have a sore throat because of the tube in your mouth during general anesthesia. This will go away in 2 to 3 days.  You may have some stomach cramps.  You may notice spotting on your panties.  You may have pain around the incision sites.   Activities after Your Discharge Follow these  guidelines to help speed your recovery at home:  Do the coughing and deep breathing as you did in the hospital for 2 weeks. Use the small blue breathing device, called the incentive spirometer for 2 weeks.  Don't drive if you are in pain or taking narcotic pain medicine. You may drive when you can safely slam on the brakes, turn the wheel forcefully, and rotate your torso comfortably. This is typically 1-2 weeks. Practice in a parking lot or side street prior to attempting to drive regularly.   Ask others to help with household chores for 4 weeks.  Do not lift anything heavier that 10 pounds for 4-6 weeks. This includes pets, children, and groceries.  Don't do strenuous activities, exercises, or sports like vacuuming, tennis, squash, etc. until your doctor says it is safe to do so. ---Maintain pelvic rest for 8 weeks. This means nothing in the vagina or rectum at all (no douching, tampons, intercourse) for 8 weeks.   Walk as you feel able. Rest often since it may take two or three weeks for your energy level to return to normal.   You may climb stairs  Avoid constipation:   -Eat fruits, vegetables, and whole grains. Eat small meals as your appetite will take time to return to normal.   -Drink 6 to 8 glasses of water each day unless your doctor has told you to limit your fluids.   -Use a laxative   or stool softener as needed if constipation becomes a problem. You may take Miralax, metamucil, Citrucil, Colace, Senekot, FiberCon, etc. If this does not relieve the constipation, try two tablespoons of Milk Of Magnesia every 8 hours until your bowels move.   You may shower. Gently wash the wounds with a mild soap and water. Pat dry.  Do not get in a hot tub, swimming pool, etc. for 6 weeks.  Do not use lotions, oils, powders on the wounds.  Do not douche, use tampons, or have sex until your doctor says it is okay.  Take your pain medicine when you need it. The medicine may not work as well if the pain is  bad.  Take the medicines you were taking before surgery. Other medications you will need are pain medications and possibly constipation and nausea medications (Zofran).  AMBULATORY SURGERY  DISCHARGE INSTRUCTIONS   The drugs that you were given will stay in your system until tomorrow so for the next 24 hours you should not:  Drive an automobile Make any legal decisions Drink any alcoholic beverage   You may resume regular meals tomorrow.  Today it is better to start with liquids and gradually work up to solid foods.  You may eat anything you prefer, but it is better to start with liquids, then soup and crackers, and gradually work up to solid foods.   Please notify your doctor immediately if you have any unusual bleeding, trouble breathing, redness and pain at the surgery site, drainage, fever, or pain not relieved by medication.    Additional Instructions:        Please contact your physician with any problems or Same Day Surgery at [phone], Monday through Friday 6 am to 4 pm, or [facility] Main number at [phone].  

## 2021-03-17 NOTE — Op Note (Addendum)
Operative Note   03/17/2021 12:23 PM  PRE-OP DIAGNOSIS: Endometrial cancer, grade 1, morbid obesity BMI 52   POST-OP DIAGNOSIS: same  SURGEON: Surgeon(s) and Role:    Mellody Drown, MD - Primary    * Benjaman Kindler, MD  ANESTHESIA: General   PROCEDURE: TOTAL LAPAROSCOPIC HYSTERECTOMY WITH BILATERAL SALPINGO OOPHORECTOMY. BILATERAL PELVIC SENTINEL LYMPH NODE INJECTION AND MAPPING AND LEFT PELVIC SENTINEL [place] NODE BIOPSY    ESTIMATED BLOOD LOSS: less than 100 mL  DRAINS: NONE  TOTAL IV FLUIDS: per anesthesia  SPECIMENS: Uterus, tubes, ovaries, left external iliac SLN, washings  COMPLICATIONS: none  DISPOSITION: PACU - hemodynamically stable.  CONDITION: stable  INDICATIONS: Grade 1 endometrial cancer  FINDINGS: Uterus slightly enlarged, cervix small and nulliparous, upper abdominal survey and adnexa normal. The patient's morbid obesity increased the difficulty of manipulating the instruments and added an hour to the time of the case.   PROCEDURE IN DETAIL: After informed consent was obtained, the patient was taken to the operating room where anesthesia was obtained without difficulty. The patient was positioned in the dorsal lithotomy position in [place] and her arms were carefully tucked at her sides and the usual precautions were taken.  She was prepped and draped in normal sterile fashion.  Time-out was performed and a Foley catheter was placed into the bladder and the cervix was infiltrated with 4 ml of ICG fluorescent dye at 3 an 9 o'clock both superficial and deep injections. A standard VCare uterine manipulator was then placed in the uterus without incident.    An open Hasson technique was used to place an infraumbilical 02-VO baloon trocar under direct visualization. The laparoscope was introduced and CO2 gas was infused for pneumoperitoneum to a pressure of 15 mm Hg.  Right and left lateral 5-mm ports and a 5-12 mm suprapubic port were placed under direct  visualization of the laparoscope using an EndoStep technique.  Cytologic washings were obtained.  The patient was placed in Trendelenburg and the bowel was displaced up into the upper abdomen.  Round ligaments were divided on each side with the EndoShears and the retroperitoneal space was opened bilaterally.  The ureters were identified and preserved.  At this point the retroperitoneal spaces were developed and the lymphatic channels were mapped to each side.  The sentinel node on the left side was then identified in the external iliac area, skeletonized and removed taking care not to injure the  obturator nerve, the ureter or the pelvic vasculature.  Similarly on the right side, the retroperitoneal spaces were developed, the lymphatic channels mapped to identify the obturator sentinel node. Because of the patients morbid obesity, exposure was very difficult and it was elected to wait for the frozen section to see if we wanted to remove this node.  With hemostasis secured, the infundibulopelvic ligaments were skeletonized, sealed and divided with the LigaSure device.  A bladder flap was created and the bladder was dissected down off the lower uterine segment and cervix using endoshears and electrocautery.  The uterine arteries were skeletonized bilaterally, sealed and divided with the LigaSure device.  A colpotomy was performed circumferentially along the V-Care ring with electrocautery and the cervix was incised from the vagina and the specimen was removed through the vagina.  A pneumo balloon was placed in the vagina and the vaginal cuff was then closed in a running continuous fashion using the EndoStitch technique with 0 Polysorb suture with careful attention to include the vaginal cuff angles and the vaginal mucosa within  the closure.  There was an air leak in the middle where the two sutures from each angle were tied together, so an additional figure of 8 was placed in that area with the Endostitch.  The  closure was satisfactory at that point as assessed from above and vaginally from below.  Intraoperative pathologic evaluation revealed favorable risk criteria and therefore further dissection was not performed.  Hemostasis was observed. The intraperitoneal pressure was dropped, and all planes of dissection, vascular pedicles and the vaginal cuff were found to be hemostatic.  The suprapubic trocar was removed and the fascia was closed with 0 Vicryl suture using the Endoclose technique. The lateral trocars were removed under visualization.   Before the umbilical trocar was removed the CO2 gas was released.  The fascia there was closed with 0 Vicryl suture in interrupted figure of eight technique.   The skin incisions were closed with subcuticular stitches and glue. The patient tolerated the procedure well.  Sponge, lap and needle counts were correct x2.  The patient was taken to recovery room in excellent condition.  Antibiotics:  given per op  VTE prophylaxis: was ordered perioperatively with SQ heparin and IPCs.  Mellody Drown, MD

## 2021-03-17 NOTE — Telephone Encounter (Signed)
On 03/17/21- I rcvd another FMLA form from [employer] to be completed for this patient. The form was faxed to cancer center on 03/14/21. Form completed and pending, Lauren, NP's signature.

## 2021-03-18 ENCOUNTER — Encounter: Payer: Self-pay | Admitting: Nurse Practitioner

## 2021-03-18 ENCOUNTER — Encounter: Payer: Self-pay | Admitting: Obstetrics and Gynecology

## 2021-03-18 LAB — BPAM RBC
Blood Product Expiration Date: 202212282359
Blood Product Expiration Date: 202301022359
Unit Type and Rh: 5100
Unit Type and Rh: 6200

## 2021-03-18 LAB — TYPE AND SCREEN
ABO/RH(D): A POS
Antibody Screen: POSITIVE
PT AG Type: POSITIVE
Unit division: 0
Unit division: 0

## 2021-03-19 LAB — CYTOLOGY - NON PAP

## 2021-03-22 NOTE — Anesthesia Postprocedure Evaluation (Signed)
Anesthesia Post Note  Patient: Teresa Cameron  Procedure(s) Performed: TOTAL LAPAROSCOPIC HYSTERECTOMY WITH BILATERAL SALPINGO OOPHORECTOMY (Bilateral) SENTINEL LYMPH NODE INJECTION AND MAPPING LIMITED LYMPHADENECTOMY FOR STAGING  Patient location during evaluation: PACU Anesthesia Type: General Level of consciousness: awake and alert Pain management: pain level controlled Vital Signs Assessment: post-procedure vital signs reviewed and stable Respiratory status: spontaneous breathing, nonlabored ventilation, respiratory function stable and patient connected to nasal cannula oxygen Cardiovascular status: blood pressure returned to baseline and stable Postop Assessment: no apparent nausea or vomiting Anesthetic complications: no   No notable events documented.   Last Vitals:  Vitals:   03/17/21 1618 03/17/21 1709  BP: 136/69 (!) 144/74  Pulse: 61 62  Resp: 16 16  Temp:  36.8 C  SpO2: 95% 95%    Last Pain:  Vitals:   03/17/21 1709  TempSrc: Oral  PainSc:                  [name]

## 2021-03-26 LAB — SURGICAL PATHOLOGY

## 2021-04-07 ENCOUNTER — Other Ambulatory Visit: Payer: Self-pay | Admitting: *Deleted

## 2021-04-07 DIAGNOSIS — D500 Iron deficiency anemia secondary to blood loss (chronic): Secondary | ICD-10-CM

## 2021-04-14 ENCOUNTER — Other Ambulatory Visit: Payer: Self-pay

## 2021-04-14 ENCOUNTER — Inpatient Hospital Stay: Payer: BC Managed Care – PPO

## 2021-04-14 ENCOUNTER — Encounter: Payer: Self-pay | Admitting: Obstetrics and Gynecology

## 2021-04-14 ENCOUNTER — Inpatient Hospital Stay: Payer: BC Managed Care – PPO | Attending: Obstetrics and Gynecology | Admitting: Obstetrics and Gynecology

## 2021-04-14 VITALS — BP 134/79 | HR 70 | Temp 96.6°F | Resp 18 | Wt 348.2 lb

## 2021-04-14 DIAGNOSIS — C541 Malignant neoplasm of endometrium: Secondary | ICD-10-CM | POA: Insufficient documentation

## 2021-04-14 DIAGNOSIS — D509 Iron deficiency anemia, unspecified: Secondary | ICD-10-CM | POA: Diagnosis not present

## 2021-04-14 DIAGNOSIS — D500 Iron deficiency anemia secondary to blood loss (chronic): Secondary | ICD-10-CM

## 2021-04-14 LAB — CBC WITH DIFFERENTIAL/PLATELET
Abs Immature Granulocytes: 0.02 10*3/uL (ref 0.00–0.07)
Basophils Absolute: 0 10*3/uL (ref 0.0–0.1)
Basophils Relative: 1 %
Eosinophils Absolute: 0.4 10*3/uL (ref 0.0–0.5)
Eosinophils Relative: 6 %
HCT: 37.8 % (ref 36.0–46.0)
Hemoglobin: 12 g/dL (ref 12.0–15.0)
Immature Granulocytes: 0 %
Lymphocytes Relative: 23 %
Lymphs Abs: 1.5 10*3/uL (ref 0.7–4.0)
MCH: 27.7 pg (ref 26.0–34.0)
MCHC: 31.7 g/dL (ref 30.0–36.0)
MCV: 87.3 fL (ref 80.0–100.0)
Monocytes Absolute: 0.6 10*3/uL (ref 0.1–1.0)
Monocytes Relative: 9 %
Neutro Abs: 3.8 10*3/uL (ref 1.7–7.7)
Neutrophils Relative %: 61 %
Platelets: 193 10*3/uL (ref 150–400)
RBC: 4.33 MIL/uL (ref 3.87–5.11)
RDW: 21.1 % — ABNORMAL HIGH (ref 11.5–15.5)
WBC: 6.2 10*3/uL (ref 4.0–10.5)
nRBC: 0 % (ref 0.0–0.2)

## 2021-04-14 NOTE — Progress Notes (Signed)
Gynecologic Oncology Consult Visit   Referring Provider: Dr. Ouida Sills  Chief Concern: grade 1 endometrial cancer, post op visit  Subjective:  Teresa Cameron is a 51 y.o. G10 female who is seen in consultation from Dr. Tressia Miners for grade 1 endometrial cancer  No complaints today.  Here for post op visit.   Gyn Oncology History She had a long h/o irregular bleeding, bleeds q other month and at time extremely heavy clots . She has required a blood transfusion at [facility] 8/22. 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  H/h : 9/31 Ferritin =5  03/17/21 underwent TLH, BSO, SLN biopsies for grade 1 endometrial cancer.   A. UTERUS WITH CERVIX, BILATERAL FALLOPIAN TUBES AND OVARIES; TOTAL  HYSTERECTOMY WITH BILATERAL SALPINGO-OOPHORECTOMY:  - ENDOMETRIUM:       - ENDOMETRIOID ENDOMETRIAL ADENOCARCINOMA, LOW-GRADE (FIGO 1-2),  LIMITED TO THE ENDOMETRIUM.       - BACKGROUND ENDOMETRIOID INTRAEPITHELIAL NEOPLASIA (EIN).       - SEE CANCER SUMMARY.  - UTERINE CERVIX:       - BENIGN TRANSFORMATION ZONE.       - NEGATIVE FOR SQUAMOUS INTRAEPITHELIAL LESION AND MALIGNANCY.  - MYOMETRIUM:       - NO SIGNIFICANT HISTOPATHOLOGIC CHANGE.  - FALLOPIAN TUBES:       - BENIGN PARATUBAL CYST.       - OTHERWISE NO SIGNIFICANT HISTOPATHOLOGIC CHANGE.  - OVARIES:       - BENIGN PHYSIOLOGIC CHANGES.   B. LYMPH NODES, LEFT EXTERNAL ILIAC SENTINEL; EXCISION:  - THREE LYMPH NODES, NEGATIVE FOR MALIGNANCY (0/3).   CASE SUMMARY: (ENDOMETRIUM)  Standard(s): AJCC-UICC 8, FIGO Cancer Report 2018   SPECIMEN  Procedure: Total hysterectomy and bilateral salpingo-oophorectomy   TUMOR  Tumor Size: Greatest dimension: 3.1 cm  Histologic Type: Endometrioid carcinoma, NOS  Histologic Grade: Low-grade (encompassing FIGO 1 and 2)  Myometrial Invasion: Not  identified  Uterine Serosa Involvement: Not identified  Cervical Stromal Involvement: Not identified  Other Tissue/Organ Involvement: Not identified  Peritoneal/Ascitic Fluid: Malignant cells not identified  Lymphovascular Invasion: Not identified   Family History: family history includes Heart disease in her maternal grandfather, mother, and paternal uncle; High blood pressure (Hypertension) in her father; Menstrual problems (age of onset: 47) in her mother; Myocardial Infarction (Heart attack) in her maternal grandfather and mother; No Known Problems in her brother.  Problem List: Patient Active Problem List   Diagnosis Date Noted   Iron deficiency anemia due to chronic blood loss 03/08/2021   Endometrial cancer ([place]) 03/03/2021   Anemia due to chronic blood loss 03/03/2021   GERD (gastroesophageal reflux disease) 03/03/2021   Past Medical History: Past Medical History:  Diagnosis Date   Anemia    Anxiety    h/o   Asthma    well controlled-no inhalers   Endometrial cancer ([place])    GERD (gastroesophageal reflux disease)    History of kidney stones    Pneumonia    h/o   Renal disorder    Past Surgical History: Past Surgical History:  Procedure Laterality Date   CHOLECYSTECTOMY     TOTAL LAPAROSCOPIC HYSTERECTOMY WITH BILATERAL SALPINGO OOPHORECTOMY Bilateral 03/17/2021   Procedure: TOTAL LAPAROSCOPIC HYSTERECTOMY WITH BILATERAL SALPINGO OOPHORECTOMY;  Surgeon: Mellody Drown, MD;  Location: [facility] ORS;  Service: Gynecology;  Laterality: Bilateral;   Family History: Family History  Problem Relation Age of Onset  Heart failure Mother    Heart failure Father    Social History: Social History   Socioeconomic History   Marital status: Married    Spouse name: Not on file   Number of children: Not on file   Years of education: Not on file   Highest education level: Not on file  Occupational History   Not on file  Tobacco Use   Smoking status: Never   Smokeless  tobacco: Never  Vaping Use   Vaping Use: Never used  Substance and Sexual Activity   Alcohol use: No   Drug use: No   Sexual activity: Not on file  Other Topics Concern   Not on file  Social History Narrative   Not on file   Social Determinants of Health   Financial Resource Strain: Not on file  Food Insecurity: Not on file  Transportation Needs: Not on file  Physical Activity: Not on file  Stress: Not on file  Social Connections: Not on file  Intimate Partner Violence: Not on file  She works as a Presenter, 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   Allergies: Allergies  Allergen Reactions   Keflex [Cephalexin] Itching   Tape Rash    And Bandaids   Current Medications: Current Outpatient Medications  Medication Sig Dispense Refill   albuterol (VENTOLIN HFA) 108 (90 Base) MCG/ACT inhaler Inhale 1-2 puffs into the lungs every 6 (six) hours as needed for wheezing or shortness of breath. (Patient not taking: Reported on 03/03/2021) 1 each 0   dicyclomine (BENTYL) 10 MG capsule Take 10 mg by mouth at bedtime.     docusate sodium (COLACE) 100 MG capsule Take 1 capsule (100 mg total) by mouth 2 (two) times daily. To keep stools soft 30 capsule 0   ferrous sulfate 325 (65 FE) MG EC tablet Take 325 mg by mouth 2 (two) times daily.     gabapentin (NEURONTIN) 800 MG tablet Take 1 tablet (800 mg total) by mouth at bedtime for 14 days. Take nightly for 3 days, then up to 14 days as needed 14 tablet 0   Multiple Vitamins-Minerals (MULTIVITAMIN WITH MINERALS) tablet Take 1 tablet by mouth daily. Flintstone with Iron     omeprazole (PRILOSEC OTC) 20 MG tablet Take 20 mg by mouth as needed.     oxyCODONE (OXY IR/ROXICODONE) 5 MG immediate release tablet Take 1 tablet (5 mg total) by mouth every 4 (four) hours as needed for severe pain. 20 tablet 0   No current facility-administered medications for this visit.   Gynecologic History:  Menarche: age 51 Irregular Pain with  menses Sexually active G0P0  Review of Systems:  General: negative for fevers, changes in weight or night sweats Skin: negative for changes in moles or sores or rash Eyes: negative for changes in vision HEENT: negative for change in hearing, tinnitus, voice changes Pulmonary: negative for orthopnea, productive cough, wheezingpositive for shortness of breath Cardiac: negative for palpitations, pain Gastrointestinal: negative for nausea, vomiting, constipation, diarrhea, hematemesis, hematochezia Genitourinary/Sexual: negative for dysuria, retention, hematuria, incontinence Ob/Gyn:  negative for abnormal bleeding, or pain Musculoskeletal: negative for pain, joint pain positive for back pain Hematology: negative for easy bruising, abnormal bleeding Neurologic/Psych: negative for headaches, seizures, paralysis, weakness, numbness  Objective:  Physical Examination:  There were no vitals taken for this visit.   ECOG Performance Status: 1 - Symptomatic but completely ambulatory  General appearance: alert, cooperative, and appears stated age 82 Lymph node survey: non-palpable, axillary, inguinal,  supraclavicular Cardiovascular: regular rate and rhythm Respiratory: normal air entry, lungs clear to auscultation Abdomen: soft, protuberant, no hernias, and well healed incision Back: inspection of back is normal Extremities:  chronic lymphedema, bandage on lower left shin.  No cellulitis in surrounding skin. Neurological exam reveals alert, oriented, normal speech, no focal findings or movement disorder noted.  Pelvic Exam: Chaperoned by NP EGBUS/Vag: normal Vagina: cuff healing well Bimanural: no masses  Labs Lab Results  Component Value Date   WBC 6.2 04/14/2021   HGB 12.0 04/14/2021   HCT 37.8 04/14/2021   MCV 87.3 04/14/2021   PLT 193 04/14/2021     Chemistry      Component Value Date/Time   NA 137 03/03/2021 1534   NA 140 11/20/2013 1111   K 4.5 03/03/2021 1534    K 3.9 11/20/2013 1111   CL 101 03/03/2021 1534   CL 102 11/20/2013 1111   CO2 28 03/03/2021 1534   CO2 31 11/20/2013 1111   BUN 11 03/03/2021 1534   BUN 11 11/20/2013 1111   CREATININE 0.75 03/03/2021 1534   CREATININE 0.78 11/20/2013 1111      Component Value Date/Time   CALCIUM 8.9 03/03/2021 1534   CALCIUM 9.2 11/20/2013 1111   ALKPHOS 84 03/03/2021 1534   ALKPHOS 92 11/20/2013 1111   AST 18 03/03/2021 1534   AST 22 11/20/2013 1111   ALT 15 03/03/2021 1534   ALT 24 11/20/2013 1111   BILITOT <0.1 (L) 03/03/2021 1534   BILITOT 0.5 11/20/2013 1111      Assessment:  Tanieka Pownall is a 51 y.o. G26 female with PMB diagnosed with Stage IA grade 1 endometrial adenocarcinoma.  On 03/17/21 underwent TLH, BSO, SLN mapping and left pelvic LN biopsies. Pathology showed non-invasive grade 1 cancer.  Washings negative.   Normal post op exam.   Medical co-morbidities complicating care: morbid obesity BMI 53.  Plan:   Problem List Items Addressed This Visit       Genitourinary   Endometrial cancer [facility]) - Primary    Discussed path report and that no further treatment would be needed in view of low risk of recurrence.    RTC 6 months with our clinic and 12 months with Dr Leafy Ro.  Reviewed symptoms that should prompt return to be seen sooner.   Mellody Drown, MD  CC:  Teresa Lighter, MD [address] [place],  [place] [id number] [phone]

## 2021-04-14 NOTE — Patient Instructions (Signed)
Call and make appointment to see Dr. Ouida Sills or Dr. Leafy Ro in 1 year.

## 2021-04-15 LAB — IRON AND TIBC
Iron: 71 ug/dL (ref 28–170)
Saturation Ratios: 15 % (ref 10.4–31.8)
TIBC: 475 ug/dL — ABNORMAL HIGH (ref 250–450)
UIBC: 404 ug/dL

## 2021-04-15 LAB — FERRITIN: Ferritin: 28 ng/mL (ref 11–307)

## 2021-10-13 ENCOUNTER — Inpatient Hospital Stay: Payer: BC Managed Care – PPO | Attending: Obstetrics and Gynecology | Admitting: Medical Oncology

## 2021-10-13 VITALS — BP 135/73 | HR 95 | Temp 99.6°F | Resp 16 | Wt 345.0 lb

## 2021-10-13 DIAGNOSIS — Z8542 Personal history of malignant neoplasm of other parts of uterus: Secondary | ICD-10-CM | POA: Diagnosis present

## 2021-10-13 DIAGNOSIS — Z08 Encounter for follow-up examination after completed treatment for malignant neoplasm: Secondary | ICD-10-CM | POA: Diagnosis not present

## 2021-10-13 DIAGNOSIS — D500 Iron deficiency anemia secondary to blood loss (chronic): Secondary | ICD-10-CM | POA: Diagnosis not present

## 2021-10-13 DIAGNOSIS — C541 Malignant neoplasm of endometrium: Secondary | ICD-10-CM | POA: Diagnosis not present

## 2021-10-13 NOTE — Progress Notes (Signed)
Returns for 6 month follow-up. States that she feels everything is going well. Denies any new problems or concerns.

## 2021-10-13 NOTE — Progress Notes (Signed)
Gynecologic Oncology Consult Visit   Referring Provider: Dr. Ouida Sills  Chief Concern: grade 1 endometrial cancer, post op visit  Subjective:  Teresa Cameron is a 51 y.o. G44 female who is seen in consultation from Dr. Tressia Miners for grade 1 endometrial cancer. Here for her 6 month follow up   She reports that she has been doing well. Feeling much better following her surgery. Taking Flintstones Iron multivitamin. Followed by GI and OB-GYN. She reports no pain, bleeding or masses. Bowel movements and urination episodes are normal.   Gyn Oncology History She had a long h/o irregular bleeding, bleeds q other month and at time extremely heavy clots . She has required a blood transfusion at [facility] 8/22. 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  H/h : 9/31 Ferritin =5  03/17/21 underwent TLH, BSO, SLN biopsies for grade 1 endometrial cancer.   A. UTERUS WITH CERVIX, BILATERAL FALLOPIAN TUBES AND OVARIES; TOTAL  HYSTERECTOMY WITH BILATERAL SALPINGO-OOPHORECTOMY:  - ENDOMETRIUM:       - ENDOMETRIOID ENDOMETRIAL ADENOCARCINOMA, LOW-GRADE (FIGO 1-2),  LIMITED TO THE ENDOMETRIUM.       - BACKGROUND ENDOMETRIOID INTRAEPITHELIAL NEOPLASIA (EIN).       - SEE CANCER SUMMARY.  - UTERINE CERVIX:       - BENIGN TRANSFORMATION ZONE.       - NEGATIVE FOR SQUAMOUS INTRAEPITHELIAL LESION AND MALIGNANCY.  - MYOMETRIUM:       - NO SIGNIFICANT HISTOPATHOLOGIC CHANGE.  - FALLOPIAN TUBES:       - BENIGN PARATUBAL CYST.       - OTHERWISE NO SIGNIFICANT HISTOPATHOLOGIC CHANGE.  - OVARIES:       - BENIGN PHYSIOLOGIC CHANGES.   B. LYMPH NODES, LEFT EXTERNAL ILIAC SENTINEL; EXCISION:  - THREE LYMPH NODES, NEGATIVE FOR MALIGNANCY (0/3).   CASE SUMMARY: (ENDOMETRIUM)  Standard(s): AJCC-UICC 8, FIGO Cancer Report 2018   SPECIMEN  Procedure: Total  hysterectomy and bilateral salpingo-oophorectomy   TUMOR  Tumor Size: Greatest dimension: 3.1 cm  Histologic Type: Endometrioid carcinoma, NOS  Histologic Grade: Low-grade (encompassing FIGO 1 and 2)  Myometrial Invasion: Not identified  Uterine Serosa Involvement: Not identified  Cervical Stromal Involvement: Not identified  Other Tissue/Organ Involvement: Not identified  Peritoneal/Ascitic Fluid: Malignant cells not identified  Lymphovascular Invasion: Not identified   Family History: family history includes Heart disease in her maternal grandfather, mother, and paternal uncle; High blood pressure (Hypertension) in her father; Menstrual problems (age of onset: 59) in her mother; Myocardial Infarction (Heart attack) in her maternal grandfather and mother; No Known Problems in her brother.  Problem List: Patient Active Problem List   Diagnosis Date Noted   Iron deficiency anemia due to chronic blood loss 03/08/2021   Endometrial cancer ([place]) 03/03/2021   Anemia due to chronic blood loss 03/03/2021   GERD (gastroesophageal reflux disease) 03/03/2021   Past Medical History: Past Medical History:  Diagnosis Date   Anemia    Anxiety    h/o   Asthma    well controlled-no inhalers   Endometrial cancer ([place])    GERD (gastroesophageal reflux disease)    History of kidney stones    Pneumonia    h/o   Renal disorder    Past Surgical History: Past Surgical History:  Procedure Laterality Date   CHOLECYSTECTOMY     TOTAL LAPAROSCOPIC HYSTERECTOMY WITH BILATERAL SALPINGO OOPHORECTOMY Bilateral 03/17/2021  Procedure: TOTAL LAPAROSCOPIC HYSTERECTOMY WITH BILATERAL SALPINGO OOPHORECTOMY;  Surgeon: Mellody Drown, MD;  Location: [facility] ORS;  Service: Gynecology;  Laterality: Bilateral;   Family History: Family History  Problem Relation Age of Onset   Heart failure Mother    Heart failure Father    Social History: Social History   Socioeconomic History   Marital status: Married     Spouse name: Not on file   Number of children: Not on file   Years of education: Not on file   Highest education level: Not on file  Occupational History   Not on file  Tobacco Use   Smoking status: Never   Smokeless tobacco: Never  Vaping Use   Vaping Use: Never used  Substance and Sexual Activity   Alcohol use: No   Drug use: No   Sexual activity: Not on file  Other Topics Concern   Not on file  Social History Narrative   Not on file   Social Determinants of Health   Financial Resource Strain: Not on file  Food Insecurity: Not on file  Transportation Needs: Not on file  Physical Activity: Not on file  Stress: Not on file  Social Connections: Not on file  Intimate Partner Violence: Not on file  She works as a Presenter, 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   Allergies: Allergies  Allergen Reactions   Keflex [Cephalexin] Itching   Tape Rash    And Bandaids   Current Medications: Current Outpatient Medications  Medication Sig Dispense Refill   albuterol (VENTOLIN HFA) 108 (90 Base) MCG/ACT inhaler Inhale 1-2 puffs into the lungs every 6 (six) hours as needed for wheezing or shortness of breath. 1 each 0   dicyclomine (BENTYL) 10 MG capsule Take 10 mg by mouth at bedtime.     docusate sodium (COLACE) 100 MG capsule Take 1 capsule (100 mg total) by mouth 2 (two) times daily. To keep stools soft 30 capsule 0   losartan-hydrochlorothiazide (HYZAAR) 50-12.5 MG tablet Take 1 tablet by mouth daily.     Multiple Vitamins-Minerals (MULTIVITAMIN WITH MINERALS) tablet Take 1 tablet by mouth daily. Flintstone with Iron     omeprazole (PRILOSEC OTC) 20 MG tablet Take 20 mg by mouth as needed.     ferrous sulfate 325 (65 FE) MG EC tablet Take 325 mg by mouth 2 (two) times daily.     gabapentin (NEURONTIN) 800 MG tablet Take 1 tablet (800 mg total) by mouth at bedtime for 14 days. Take nightly for 3 days, then up to 14 days as needed 14 tablet 0   oxyCODONE (OXY  IR/ROXICODONE) 5 MG immediate release tablet Take 1 tablet (5 mg total) by mouth every 4 (four) hours as needed for severe pain. (Patient not taking: Reported on 10/13/2021) 20 tablet 0   No current facility-administered medications for this visit.   Gynecologic History:  Menarche: age 80 Irregular Pain with menses Sexually active G0P0  Review of Systems:  General: negative for fevers, changes in weight or night sweats Skin: negative for changes in moles or sores or rash Eyes: negative for changes in vision HEENT: negative for change in hearing, tinnitus, voice changes Pulmonary: negative for orthopnea, productive cough, wheezing Cardiac: negative for palpitations, pain Gastrointestinal: negative for nausea, vomiting, constipation, diarrhea, hematemesis, hematochezia Genitourinary/Sexual: negative for dysuria, retention, hematuria, incontinence Ob/Gyn:  negative for abnormal bleeding, or pain Musculoskeletal: negative for pain, joint pain  Hematology: negative for easy bruising, abnormal bleeding Neurologic/Psych: negative for headaches,  seizures, paralysis, weakness, numbness  Objective:  Physical Examination:  BP 135/73   Pulse 95   Temp 99.6 F (37.6 C) (Tympanic)   Resp 16   Wt (!) 345 lb (156.5 kg)   SpO2 96%   BMI 52.46 kg/m    ECOG Performance Status: 1 - Symptomatic but completely ambulatory  General appearance: alert, cooperative, and appears stated age HEENT:PERRLA Lymph node survey: non-palpable, axillary, inguinal, supraclavicular Cardiovascular: regular rate and rhythm Respiratory: normal air entry, lungs clear to auscultation Abdomen: soft, protuberant, no hernias, and well healed incision Back: inspection of back is normal Extremities:  chronic lymphedema, bandage on lower left shin.  No cellulitis in surrounding skin. Neurological exam reveals alert, oriented, normal speech, no focal findings or movement disorder noted.  Pelvic Exam: EGBUS/Vag:  normal Vagina: cuff healing well Bimanural: no masses  Labs Lab Results  Component Value Date   WBC 6.2 04/14/2021   HGB 12.0 04/14/2021   HCT 37.8 04/14/2021   MCV 87.3 04/14/2021   PLT 193 04/14/2021     Chemistry      Component Value Date/Time   NA 137 03/03/2021 1534   NA 140 11/20/2013 1111   K 4.5 03/03/2021 1534   K 3.9 11/20/2013 1111   CL 101 03/03/2021 1534   CL 102 11/20/2013 1111   CO2 28 03/03/2021 1534   CO2 31 11/20/2013 1111   BUN 11 03/03/2021 1534   BUN 11 11/20/2013 1111   CREATININE 0.75 03/03/2021 1534   CREATININE 0.78 11/20/2013 1111      Component Value Date/Time   CALCIUM 8.9 03/03/2021 1534   CALCIUM 9.2 11/20/2013 1111   ALKPHOS 84 03/03/2021 1534   ALKPHOS 92 11/20/2013 1111   AST 18 03/03/2021 1534   AST 22 11/20/2013 1111   ALT 15 03/03/2021 1534   ALT 24 11/20/2013 1111   BILITOT <0.1 (L) 03/03/2021 1534   BILITOT 0.5 11/20/2013 1111      Assessment:  Danira Nylander is a 51 y.o. G43 female with PMB diagnosed with Stage IA grade 1 endometrial adenocarcinoma.  On 03/17/21 underwent TLH, BSO, SLN mapping and left pelvic LN biopsies. Pathology showed non-invasive grade 1 cancer.  Washings negative.   Normal post op exam.   Medical co-morbidities complicating care: morbid obesity BMI 53.  Plan:   Problem List Items Addressed This Visit       Genitourinary   Endometrial cancer () - Primary     Other   Iron deficiency anemia due to chronic blood loss (Chronic)     Doing well. No sign of reoccurrence. Continue follow up with GI and OB-GYN.    RTC 6 months with our clinic and 12 months with Dr Leafy Ro.  Reviewed symptoms that should prompt return to be seen sooner.   Hughie Closs, PA-C  CC:  Gladstone Lighter, [address] [place],  [place] [id number] [phone]

## 2021-12-31 ENCOUNTER — Ambulatory Visit: Payer: BC Managed Care – PPO | Admitting: Anesthesiology

## 2021-12-31 ENCOUNTER — Encounter
Admission: RE | Disposition: A | Discharge status: Home / Self Care | Payer: Self-pay | Source: Home / Self Care | Attending: Gastroenterology

## 2021-12-31 ENCOUNTER — Encounter: Payer: Self-pay | Admitting: *Deleted

## 2021-12-31 ENCOUNTER — Ambulatory Visit
Admission: RE | Admit: 2021-12-31 | Discharge: 2021-12-31 | Disposition: A | Discharge status: Home / Self Care | Payer: BC Managed Care – PPO | Attending: Gastroenterology | Admitting: Gastroenterology

## 2021-12-31 ENCOUNTER — Other Ambulatory Visit: Payer: Self-pay

## 2021-12-31 DIAGNOSIS — J45909 Unspecified asthma, uncomplicated: Secondary | ICD-10-CM | POA: Insufficient documentation

## 2021-12-31 DIAGNOSIS — K5730 Diverticulosis of large intestine without perforation or abscess without bleeding: Secondary | ICD-10-CM | POA: Insufficient documentation

## 2021-12-31 DIAGNOSIS — F419 Anxiety disorder, unspecified: Secondary | ICD-10-CM | POA: Diagnosis not present

## 2021-12-31 DIAGNOSIS — Z6841 Body Mass Index (BMI) 40.0 and over, adult: Secondary | ICD-10-CM | POA: Diagnosis not present

## 2021-12-31 DIAGNOSIS — Z79899 Other long term (current) drug therapy: Secondary | ICD-10-CM | POA: Insufficient documentation

## 2021-12-31 DIAGNOSIS — Z1211 Encounter for screening for malignant neoplasm of colon: Secondary | ICD-10-CM | POA: Diagnosis not present

## 2021-12-31 DIAGNOSIS — D123 Benign neoplasm of transverse colon: Secondary | ICD-10-CM | POA: Insufficient documentation

## 2021-12-31 DIAGNOSIS — Z8542 Personal history of malignant neoplasm of other parts of uterus: Secondary | ICD-10-CM | POA: Insufficient documentation

## 2021-12-31 DIAGNOSIS — K219 Gastro-esophageal reflux disease without esophagitis: Secondary | ICD-10-CM | POA: Diagnosis not present

## 2021-12-31 DIAGNOSIS — I10 Essential (primary) hypertension: Secondary | ICD-10-CM | POA: Diagnosis not present

## 2021-12-31 HISTORY — PX: COLONOSCOPY WITH PROPOFOL: SHX5780

## 2021-12-31 LAB — POCT PREGNANCY, URINE: Preg Test, Ur: NEGATIVE

## 2021-12-31 SURGERY — COLONOSCOPY WITH PROPOFOL
Anesthesia: General

## 2021-12-31 MED ORDER — PROPOFOL 10 MG/ML IV BOLUS
INTRAVENOUS | Status: AC
  Filled 2021-12-31: qty 40

## 2021-12-31 MED ORDER — DEXMEDETOMIDINE HCL IN NACL 200 MCG/50ML IV SOLN
INTRAVENOUS | Status: DC | PRN
  Administered 2021-12-31: 16 ug via INTRAVENOUS

## 2021-12-31 MED ORDER — SODIUM CHLORIDE 0.9 % IV SOLN: INTRAVENOUS | Status: DC

## 2021-12-31 MED ORDER — PROPOFOL 500 MG/50ML IV EMUL
INTRAVENOUS | Status: DC | PRN
  Administered 2021-12-31: 110 ug/kg/min via INTRAVENOUS
  Administered 2021-12-31 (×2): 50 mg via INTRAVENOUS

## 2021-12-31 NOTE — Interval H&P Note (Signed)
History and Physical Interval Note:  12/31/2021 8:15 AM  Teresa Cameron  has presented today for surgery, with the diagnosis of Colon Cancer Screening.  The various methods of treatment have been discussed with the patient and family. After consideration of risks, benefits and other options for treatment, the patient has consented to  Procedure(s): COLONOSCOPY WITH PROPOFOL (N/A) as a surgical intervention.  The patient's history has been reviewed, patient examined, no change in status, stable for surgery.  I have reviewed the patient's chart and labs.  Questions were answered to the patient's satisfaction.     Lesly Rubenstein  Ok to proceed with colonoscopy

## 2021-12-31 NOTE — Anesthesia Postprocedure Evaluation (Signed)
Anesthesia Post Note  Patient: Teresa Cameron  Procedure(s) Performed: COLONOSCOPY WITH PROPOFOL  Patient location during evaluation: PACU Anesthesia Type: General Level of consciousness: awake and alert, oriented and patient cooperative Pain management: pain level controlled Vital Signs Assessment: post-procedure vital signs reviewed and stable Respiratory status: spontaneous breathing, nonlabored ventilation and respiratory function stable Cardiovascular status: blood pressure returned to baseline and stable Postop Assessment: adequate PO intake Anesthetic complications: no   No notable events documented.   Last Vitals:  Vitals:   12/31/21 0852 12/31/21 0902  BP: 107/65 112/67  Pulse: 82 76  Resp: 15 (!) 25  Temp:    SpO2: 97% 99%    Last Pain:  Vitals:   12/31/21 0902  TempSrc:   PainSc: 0-No pain                 Darrin Nipper

## 2021-12-31 NOTE — Op Note (Signed)
[facility] Patient Name: Teresa Cameron Procedure Date: 12/31/2021 8:19 AM MRN: [id number] Account #: [id number] Date of Birth: [birth year] Admit Type: Outpatient Age: 51 Room: [facility] ENDO ROOM 3 Gender: Female Note Status: Finalized Instrument Name: Jasper Riling [id number] Procedure:             Colonoscopy Indications:           Screening for colorectal malignant neoplasm Providers:             Andrey Farmer MD, MD Referring MD:          Gladstone Lighter, MD (Referring MD) Medicines:             Monitored Anesthesia Care Complications:         No immediate complications. Estimated blood loss:                         Minimal. Procedure:             Pre-Anesthesia Assessment:                        - Prior to the procedure, a History and Physical was                         performed, and patient medications and allergies were                         reviewed. The patient is competent. The risks and                         benefits of the procedure and the sedation options and                         risks were discussed with the patient. All questions                         were answered and informed consent was obtained.                         Patient identification and proposed procedure were                         verified by the physician, the nurse, the                         anesthesiologist, the anesthetist and the technician                         in the endoscopy suite. Mental Status Examination:                         alert and oriented. Airway Examination: normal                         oropharyngeal airway and neck mobility. Respiratory                         Examination: clear to auscultation. CV Examination:  normal. Prophylactic Antibiotics: The patient does not                         require prophylactic antibiotics. Prior                         Anticoagulants: The patient has taken no previous                          anticoagulant or antiplatelet agents. ASA Grade                         Assessment: III - A patient with severe systemic                         disease. After reviewing the risks and benefits, the                         patient was deemed in satisfactory condition to                         undergo the procedure. The anesthesia plan was to use                         monitored anesthesia care (MAC). Immediately prior to                         administration of medications, the patient was                         re-assessed for adequacy to receive sedatives. The                         heart rate, respiratory rate, oxygen saturations,                         blood pressure, adequacy of pulmonary ventilation, and                         response to care were monitored throughout the                         procedure. The physical status of the patient was                         re-assessed after the procedure.                        After obtaining informed consent, the colonoscope was                         passed under direct vision. Throughout the procedure,                         the patient's blood pressure, pulse, and oxygen                         saturations were monitored continuously. The  Colonoscope was introduced through the anus and                         advanced to the the cecum, identified by appendiceal                         orifice and ileocecal valve. The colonoscopy was                         performed without difficulty. The patient tolerated                         the procedure well. The quality of the bowel                         preparation was good. Findings:      The perianal and digital rectal examinations were normal.      A 3 mm polyp was found in the proximal transverse colon. The polyp was       sessile. The polyp was removed with a cold snare. Resection and       retrieval were complete. Estimated blood  loss was minimal.      A single small-mouthed diverticulum was found in the sigmoid colon.      The exam was otherwise without abnormality on direct and retroflexion       views. Impression:            - One 3 mm polyp in the proximal transverse colon,                         removed with a cold snare. Resected and retrieved.                        - Diverticulosis in the sigmoid colon.                        - The examination was otherwise normal on direct and                         retroflexion views. Recommendation:        - Discharge patient to home.                        - Resume previous diet.                        - Continue present medications.                        - Await pathology results.                        - Repeat colonoscopy in 7 years for surveillance.                        - Return to referring physician as previously                         scheduled. Procedure Code(s):     --- Professional ---  45385, Colonoscopy, flexible; with removal of                         tumor(s), polyp(s), or other lesion(s) by snare                         technique Diagnosis Code(s):     --- Professional ---                        Z12.11, Encounter for screening for malignant neoplasm                         of colon                        K63.5, Polyp of colon                        K57.30, Diverticulosis of large intestine without                         perforation or abscess without bleeding CPT copyright 2019 American Medical Association. All rights reserved. The codes documented in this report are preliminary and upon coder review may  be revised to meet current compliance requirements. Andrey Farmer MD, MD 12/31/2021 8:40:40 AM Number of Addenda: 0 Note Initiated On: 12/31/2021 8:19 AM Scope Withdrawal Time: 0 hours 8 minutes 15 seconds  Total Procedure Duration: 0 hours 14 minutes 23 seconds  Estimated Blood Loss:  Estimated blood loss was  minimal.      [facility]

## 2021-12-31 NOTE — Transfer of Care (Signed)
Immediate Anesthesia Transfer of Care Note  Patient: Teresa Cameron  Procedure(s) Performed: COLONOSCOPY WITH PROPOFOL  Patient Location: PACU  Anesthesia Type:General  Level of Consciousness: awake and alert   Airway & Oxygen Therapy: Patient Spontanous Breathing and Patient connected to nasal cannula oxygen  Post-op Assessment: Report given to RN and Post -op Vital signs reviewed and stable  Post vital signs: Reviewed and stable  Last Vitals:  Vitals Value Taken Time  BP 98/52 12/31/21 0845  Temp 36.8 C 12/31/21 0842  Pulse 84 12/31/21 0845  Resp 16 12/31/21 0845  SpO2 97 % 12/31/21 0845  Vitals shown include unvalidated device data.  Last Pain:  Vitals:   12/31/21 0842  TempSrc: Temporal  PainSc: 0-No pain         Complications: No notable events documented.

## 2021-12-31 NOTE — H&P (Signed)
Outpatient short stay form Pre-procedure 12/31/2021  Lesly Rubenstein, MD  Primary Physician: Gladstone Lighter, MD  Reason for visit:  Screening  History of present illness:    51 y/o lady with obesity, hypertension, and IBS-C here for screening colonoscopy. No blood thinners. No family history of GI malignancies. History of cholecystectomy and hysterectomy.    Current Facility-Administered Medications:    0.9 %  sodium chloride infusion, , Intravenous, Continuous, Elky Funches, Hilton Cork, MD  Medications Prior to Admission  Medication Sig Dispense Refill Last Dose   losartan-hydrochlorothiazide (HYZAAR) 50-12.5 MG tablet Take 1 tablet by mouth daily.   12/31/2021   albuterol (VENTOLIN HFA) 108 (90 Base) MCG/ACT inhaler Inhale 1-2 puffs into the lungs every 6 (six) hours as needed for wheezing or shortness of breath. 1 each 0    dicyclomine (BENTYL) 10 MG capsule Take 10 mg by mouth at bedtime.      docusate sodium (COLACE) 100 MG capsule Take 1 capsule (100 mg total) by mouth 2 (two) times daily. To keep stools soft 30 capsule 0    ferrous sulfate 325 (65 FE) MG EC tablet Take 325 mg by mouth 2 (two) times daily.      gabapentin (NEURONTIN) 800 MG tablet Take 1 tablet (800 mg total) by mouth at bedtime for 14 days. Take nightly for 3 days, then up to 14 days as needed 14 tablet 0    Multiple Vitamins-Minerals (MULTIVITAMIN WITH MINERALS) tablet Take 1 tablet by mouth daily. [place] with Iron      omeprazole (PRILOSEC OTC) 20 MG tablet Take 20 mg by mouth as needed.      oxyCODONE (OXY IR/ROXICODONE) 5 MG immediate release tablet Take 1 tablet (5 mg total) by mouth every 4 (four) hours as needed for severe pain. (Patient not taking: Reported on 10/13/2021) 20 tablet 0      Allergies  Allergen Reactions   Keflex [Cephalexin] Itching   Tape Rash    And Bandaids     Past Medical History:  Diagnosis Date   Anemia    Anxiety    h/o   Asthma    well controlled-no inhalers    Endometrial cancer (HCC)    GERD (gastroesophageal reflux disease)    History of kidney stones    Pneumonia    h/o   Renal disorder     Review of systems:  Otherwise negative.    Physical Exam  Gen: Alert, oriented. Appears stated age.  HEENT: PERRLA. Lungs: No respiratory distress CV: RRR Abd: soft, benign, no masses Ext: No edema    Planned procedures: Proceed with colonoscopy. The patient understands the nature of the planned procedure, indications, risks, alternatives and potential complications including but not limited to bleeding, infection, perforation, damage to internal organs and possible oversedation/side effects from anesthesia. The patient agrees and gives consent to proceed.  Please refer to procedure notes for findings, recommendations and patient disposition/instructions.     Lesly Rubenstein, MD [facility]

## 2021-12-31 NOTE — Anesthesia Preprocedure Evaluation (Addendum)
Anesthesia Evaluation  Patient identified by MRN, date of birth, ID band Patient awake    Reviewed: Allergy & Precautions, NPO status , Patient's Chart, lab work & pertinent test results  History of Anesthesia Complications Negative for: history of anesthetic complications  Airway Mallampati: IV   Neck ROM: Full    Dental  (+) Chipped   Pulmonary asthma ,    Pulmonary exam normal breath sounds clear to auscultation       Cardiovascular Exercise Tolerance: Good Normal cardiovascular exam Rhythm:Regular Rate:Normal  Myocardial perfusion 02/16/16:  Normal myocardial perfusion scan no evidence of stress-induced myocardial ischemia ejection fraction of 59% , two day study, conclusion negative scan   Neuro/Psych PSYCHIATRIC DISORDERS Anxiety negative neurological ROS     GI/Hepatic GERD  ,  Endo/Other  Class 3 obesity  Renal/GU Renal disease (nephrolithiasis)     Musculoskeletal   Abdominal   Peds  Hematology negative hematology ROS (+)   Anesthesia Other Findings   Reproductive/Obstetrics Endometrial CA s/p hysterectomy                            Anesthesia Physical Anesthesia Plan  ASA: 3  Anesthesia Plan: General   Post-op Pain Management:    Induction: Intravenous  PONV Risk Score and Plan: 3 and Propofol infusion, TIVA and Treatment may vary due to age or medical condition  Airway Management Planned: Natural Airway  Additional Equipment:   Intra-op Plan:   Post-operative Plan:   Informed Consent: I have reviewed the patients History and Physical, chart, labs and discussed the procedure including the risks, benefits and alternatives for the proposed anesthesia with the patient or authorized representative who has indicated his/her understanding and acceptance.       Plan Discussed with: CRNA  Anesthesia Plan Comments: (LMA/GETA backup discussed.  Patient consented for  risks of anesthesia including but not limited to:  - adverse reactions to medications - damage to eyes, teeth, lips or other oral mucosa - nerve damage due to positioning  - sore throat or hoarseness - damage to heart, brain, nerves, lungs, other parts of body or loss of life  Informed patient about role of CRNA in peri- and intra-operative care.  Patient voiced understanding.)        Anesthesia Quick Evaluation

## 2022-01-03 ENCOUNTER — Encounter: Payer: Self-pay | Admitting: Gastroenterology

## 2022-01-03 LAB — SURGICAL PATHOLOGY

## 2022-04-13 ENCOUNTER — Inpatient Hospital Stay: Payer: BC Managed Care – PPO | Attending: Obstetrics and Gynecology

## 2022-04-22 ENCOUNTER — Encounter: Payer: Self-pay | Admitting: Emergency Medicine

## 2022-04-22 ENCOUNTER — Ambulatory Visit
Admission: EM | Admit: 2022-04-22 | Discharge: 2022-04-22 | Disposition: A | Discharge status: Home / Self Care | Payer: BC Managed Care – PPO | Attending: Family Medicine | Admitting: Family Medicine

## 2022-04-22 DIAGNOSIS — R319 Hematuria, unspecified: Secondary | ICD-10-CM | POA: Diagnosis not present

## 2022-04-22 DIAGNOSIS — Z87442 Personal history of urinary calculi: Secondary | ICD-10-CM

## 2022-04-22 DIAGNOSIS — R300 Dysuria: Secondary | ICD-10-CM | POA: Diagnosis not present

## 2022-04-22 DIAGNOSIS — B372 Candidiasis of skin and nail: Secondary | ICD-10-CM | POA: Diagnosis not present

## 2022-04-22 LAB — URINALYSIS, ROUTINE W REFLEX MICROSCOPIC
Bilirubin Urine: NEGATIVE
Glucose, UA: NEGATIVE mg/dL
Ketones, ur: NEGATIVE mg/dL
Leukocytes,Ua: NEGATIVE
Nitrite: NEGATIVE
Protein, ur: NEGATIVE mg/dL
Specific Gravity, Urine: 1.025 (ref 1.005–1.030)
pH: 6.5 (ref 5.0–8.0)

## 2022-04-22 LAB — URINALYSIS, MICROSCOPIC (REFLEX): Bacteria, UA: NONE SEEN

## 2022-04-22 LAB — WET PREP, GENITAL
Clue Cells Wet Prep HPF POC: NONE SEEN
Sperm: NONE SEEN
Trich, Wet Prep: NONE SEEN
WBC, Wet Prep HPF POC: <10 — AB (ref <10)
Yeast Wet Prep HPF POC: NONE SEEN

## 2022-04-22 MED ORDER — FLUCONAZOLE 150 MG PO TABS: 150.0000 mg | ORAL_TABLET | ORAL | 0 refills | Status: AC

## 2022-04-22 NOTE — ED Triage Notes (Signed)
Patient c/o burning when urinating and vaginal discharge that started 2-3 days.  Patient has recently been on an antibiotic.

## 2022-04-22 NOTE — Discharge Instructions (Signed)
Stop by the pharmacy to pick up your prescriptions.  Follow up with your primary care provider as needed.  

## 2022-04-22 NOTE — ED Provider Notes (Signed)
[facility]    CSN: [id number] Arrival date & time: 04/22/22  0936      History   Chief Complaint Chief Complaint  Patient presents with   Dysuria   Vaginal Discharge    HPI Teresa Cameron is a 52 y.o. female.   HPI   [name] presents for vaginal itching and burning. She took 3 out of 7 nights of Monistat after taking antibiotics. Has lower back pain and bladder pain. Has dysuria, urinary frequency and urgency with pelvic pain She wants to make sure she has UTI. She has history of kidney stones. Her last one was a few years ago.  None required shockwave therapy or stents. No fever, chills, vomiting. She saw some dark coloring on her pads and toilet paper.  Had some nausea.   She has been coughing a lot. Took antibiotics and completed then on 04/13/21. The cough is improving.      Past Medical History:  Diagnosis Date   Anemia    Anxiety    h/o   Asthma    well controlled-no inhalers   Endometrial cancer ([place])    GERD (gastroesophageal reflux disease)    History of kidney stones    Pneumonia    h/o   Renal disorder     Patient Active Problem List   Diagnosis Date Noted   Iron deficiency anemia due to chronic blood loss 03/08/2021   Endometrial cancer ([place]) 03/03/2021   Anemia due to chronic blood loss 03/03/2021   GERD (gastroesophageal reflux disease) 03/03/2021    Past Surgical History:  Procedure Laterality Date   CHOLECYSTECTOMY     COLONOSCOPY     COLONOSCOPY WITH PROPOFOL N/A 12/31/2021   Procedure: COLONOSCOPY WITH PROPOFOL;  Surgeon: Lesly Rubenstein, MD;  Location: [facility] ENDOSCOPY;  Service: Endoscopy;  Laterality: N/A;   TOTAL LAPAROSCOPIC HYSTERECTOMY WITH BILATERAL SALPINGO OOPHORECTOMY Bilateral 03/17/2021   Procedure: TOTAL LAPAROSCOPIC HYSTERECTOMY WITH BILATERAL SALPINGO OOPHORECTOMY;  Surgeon: Mellody Drown, MD;  Location: [facility] ORS;  Service: Gynecology;  Laterality: Bilateral;    OB History   No obstetric history  on file.      Home Medications    Prior to Admission medications   Medication Sig Start Date End Date Taking? Authorizing Provider  fluconazole (DIFLUCAN) 150 MG tablet Take 1 tablet (150 mg total) by mouth every 3 (three) days for 3 doses. 04/22/22 04/29/22 Yes Itzae Miralles, DO  losartan-hydrochlorothiazide (HYZAAR) 50-12.5 MG tablet Take 1 tablet by mouth daily. 08/22/21  Yes [provider]  albuterol (VENTOLIN HFA) 108 (90 Base) MCG/ACT inhaler Inhale 1-2 puffs into the lungs every 6 (six) hours as needed for wheezing or shortness of breath. 11/13/20   Verda Cumins, MD  dicyclomine (BENTYL) 10 MG capsule Take 10 mg by mouth at bedtime.    [provider]  docusate sodium (COLACE) 100 MG capsule Take 1 capsule (100 mg total) by mouth 2 (two) times daily. To keep stools soft 03/17/21   Benjaman Kindler, MD  ferrous sulfate 325 (65 FE) MG EC tablet Take 325 mg by mouth 2 (two) times daily. 02/24/21 04/14/21  [provider]  gabapentin (NEURONTIN) 800 MG tablet Take 1 tablet (800 mg total) by mouth at bedtime for 14 days. Take nightly for 3 days, then up to 14 days as needed 03/17/21 04/14/21  Benjaman Kindler, MD  Multiple Vitamins-Minerals (MULTIVITAMIN WITH MINERALS) tablet Take 1 tablet by mouth daily. [place] with Iron    [provider]  omeprazole (PRILOSEC  OTC) 20 MG tablet Take 20 mg by mouth as needed.    [provider]  oxyCODONE (OXY IR/ROXICODONE) 5 MG immediate release tablet Take 1 tablet (5 mg total) by mouth every 4 (four) hours as needed for severe pain. Patient not taking: Reported on 10/13/2021 03/17/21   Benjaman Kindler, MD  fluticasone [facility]) 50 MCG/ACT nasal spray Place 2 sprays into both nostrils daily. 10/12/16 04/04/19  Lorin Picket, PA-C    Family History Family History  Problem Relation Age of Onset   Heart failure Mother    Heart failure Father     Social History Social History   Tobacco Use   Smoking  status: Never   Smokeless tobacco: Never  Vaping Use   Vaping Use: Never used  Substance Use Topics   Alcohol use: No   Drug use: No     Allergies   Keflex [cephalexin] and Tape   Review of Systems Review of Systems: negative unless otherwise stated in HPI.      Physical Exam Triage Vital Signs ED Triage Vitals  Enc Vitals Group     BP 04/22/22 0959 124/88     Pulse Rate 04/22/22 0959 76     Resp 04/22/22 0959 14     Temp 04/22/22 0959 98 F (36.7 C)     Temp Source 04/22/22 0959 Oral     SpO2 04/22/22 0959 100 %     Weight 04/22/22 0957 (!) 330 lb (149.7 kg)     Height 04/22/22 0957 '5\' 8"'$  (1.727 m)     Head Circumference --      Peak Flow --      Pain Score 04/22/22 0957 4     Pain Loc --      Pain Edu? --      Excl. in [place]? --    No data found.  Updated Vital Signs BP 124/88 (BP Location: Left Arm)   Pulse 76   Temp 98 F (36.7 C) (Oral)   Resp 14   Ht '5\' 8"'$  (1.727 m)   Wt (!) 149.7 kg   LMP 01/06/2021   SpO2 100%   BMI 50.18 kg/m   Visual Acuity Right Eye Distance:   Left Eye Distance:   Bilateral Distance:    Right Eye Near:   Left Eye Near:    Bilateral Near:     Physical Exam GEN:     alert, non-toxic appearing female in no distress    HENT:  mucus membranes moist, no nasal discharge, no scleral icterus EYES:   pupils equal and reactive, no scleral injection or discharge NECK:  normal ROM RESP:  no increased work of breathing CVS:   regular rate ABD:  soft, suprapubic tenderness, + bilateral CVA tenderness  Skin:   warm and dry, erythema and satellite lesions in lower abdominal pannus    UC Treatments / Results  Labs (all labs ordered are listed, but only abnormal results are displayed) Labs Reviewed  WET PREP, GENITAL - Abnormal; Notable for the following components:      Result Value   WBC, Wet Prep HPF POC <10 (*)    All other components within normal limits  URINALYSIS, ROUTINE W REFLEX MICROSCOPIC - Abnormal; Notable for the  following components:   Hgb urine dipstick MODERATE (*)    All other components within normal limits  URINALYSIS, MICROSCOPIC (REFLEX)    EKG   Radiology No results found.  Procedures Procedures (including critical care time)  Medications Ordered  in UC Medications - No data to display  Initial Impression / Assessment and Plan / UC Course  I have reviewed the triage vital signs and the nursing notes.  Pertinent labs & imaging results that were available during my care of the patient were reviewed by me and considered in my medical decision making (see chart for details).       Pt is a 52 y.o. female who presents for 3 days of vaginal irritation, dysuria and hematuria.  Jolan is afebrile here without recent antipyretics. Satting well on room air. Overall pt is non-toxic appearing, well hydrated, without respiratory distress. Wet prep unremarkable. She does have evidence of candidal intertrigo.  Prescribed Diflucan for 3 doses.  Urinalysis with hematuria that was supported, on microscopy.  Patient with history of kidney stones.  On chart review, she had a 3 mm stone in the distal left ureter with minimal hydronephrosis and hydroureter present.  Patient advised to get a CT to rule out a kidney stone in the emergency department.  She states that she will go if her pain gets worse. Pt stable for discharge.  ED precautions given and she voiced understanding. Typical duration of symptoms discussed.   Discussed MDM, treatment plan and plan for follow-up with patient who agrees with plan.     Final Clinical Impressions(s) / UC Diagnoses   Final diagnoses:  Dysuria  Candidal intertrigo  Hematuria, unspecified type  Personal history of kidney stones     Discharge Instructions      Stop by the pharmacy to pick up your prescriptions.  Follow up with your primary care provider as needed.      ED Prescriptions     Medication Sig Dispense Auth. Provider   fluconazole  (DIFLUCAN) 150 MG tablet Take 1 tablet (150 mg total) by mouth every 3 (three) days for 3 doses. 3 tablet Lyndee Hensen, DO      PDMP not reviewed this encounter.   Lyndee Hensen, DO 04/22/22 1442

## 2022-10-27 ENCOUNTER — Ambulatory Visit
Admission: EM | Admit: 2022-10-27 | Discharge: 2022-10-27 | Disposition: A | Discharge status: Home / Self Care | Payer: BC Managed Care – PPO

## 2022-10-27 DIAGNOSIS — T63301A Toxic effect of unspecified spider venom, accidental (unintentional), initial encounter: Secondary | ICD-10-CM | POA: Diagnosis not present

## 2022-10-27 MED ORDER — DEXAMETHASONE SODIUM PHOSPHATE 10 MG/ML IJ SOLN
10.0000 mg | Freq: Once | INTRAMUSCULAR | Status: AC
  Administered 2022-10-27: 10 mg via INTRAMUSCULAR

## 2022-10-27 MED ORDER — PREDNISONE 10 MG (21) PO TBPK: ORAL_TABLET | ORAL | 0 refills | Status: AC

## 2022-10-27 MED ORDER — AMOXICILLIN-POT CLAVULANATE 875-125 MG PO TABS: 1.0000 | ORAL_TABLET | Freq: Two times a day (BID) | ORAL | 0 refills | Status: AC

## 2022-10-27 NOTE — ED Provider Notes (Signed)
[facility]    CSN: [id number] Arrival date & time: 10/27/22  0801      History   Chief Complaint Chief Complaint  Patient presents with   Animal Bite    HPI Teresa Cameron is a 52 y.o. female.   HPI  52 year old female with a past medical history significant for renal disorder, GERD, asthma, IDA, and anxiety presents for evaluation of a possible spider bite to her left cheek below her eye.  She states this happened when she was at work.  She did not see the spider but she suspects that she was bitten by 1 because she walked under a tree where she usually sees a spider.  She is complaining of burning around her eye, swelling to her orbit and eyelid, and blurry vision in her left eye.  No fever, nausea or vomiting, shortness breath, or throat tightness.  She has applied ice to her eye.  Past Medical History:  Diagnosis Date   Anemia    Anxiety    h/o   Asthma    well controlled-no inhalers   Endometrial cancer (HCC)    GERD (gastroesophageal reflux disease)    History of kidney stones    Pneumonia    h/o   Renal disorder     Patient Active Problem List   Diagnosis Date Noted   Iron deficiency anemia due to chronic blood loss 03/08/2021   Endometrial cancer (HCC) 03/03/2021   Anemia due to chronic blood loss 03/03/2021   GERD (gastroesophageal reflux disease) 03/03/2021    Past Surgical History:  Procedure Laterality Date   CHOLECYSTECTOMY     COLONOSCOPY     COLONOSCOPY WITH PROPOFOL N/A 12/31/2021   Procedure: COLONOSCOPY WITH PROPOFOL;  Surgeon: Regis Bill, MD;  Location: [facility];  Service: Endoscopy;  Laterality: N/A;   TOTAL LAPAROSCOPIC HYSTERECTOMY WITH BILATERAL SALPINGO OOPHORECTOMY Bilateral 03/17/2021   Procedure: TOTAL LAPAROSCOPIC HYSTERECTOMY WITH BILATERAL SALPINGO OOPHORECTOMY;  Surgeon: Leida Lauth, MD;  Location: [facility] ORS;  Service: Gynecology;  Laterality: Bilateral;    OB History   No obstetric history  on file.      Home Medications    Prior to Admission medications   Medication Sig Start Date End Date Taking? Authorizing Provider  albuterol (VENTOLIN HFA) 108 (90 Base) MCG/ACT inhaler Inhale 1-2 puffs into the lungs every 6 (six) hours as needed for wheezing or shortness of breath. 11/13/20  Yes Delton See, MD  amoxicillin-clavulanate (AUGMENTIN) 875-125 MG tablet Take 1 tablet by mouth every 12 (twelve) hours for 5 days. 10/27/22 11/01/22 Yes Becky Augusta, NP  dicyclomine (BENTYL) 10 MG capsule Take 10 mg by mouth at bedtime.   Yes [provider]  docusate sodium (COLACE) 100 MG capsule Take 1 capsule (100 mg total) by mouth 2 (two) times daily. To keep stools soft 03/17/21  Yes Christeen Douglas, MD  ferrous sulfate 325 (65 FE) MG EC tablet Take 325 mg by mouth 2 (two) times daily. 02/24/21 10/27/22 Yes [provider]  gabapentin (NEURONTIN) 800 MG tablet Take 1 tablet (800 mg total) by mouth at bedtime for 14 days. Take nightly for 3 days, then up to 14 days as needed 03/17/21 10/27/22 Yes Christeen Douglas, MD  losartan-hydrochlorothiazide [facility]) 50-12.5 MG tablet Take 1 tablet by mouth daily. 08/22/21  Yes [provider]  Multiple Vitamins-Minerals (MULTIVITAMIN WITH MINERALS) tablet Take 1 tablet by mouth daily. [place] with Iron   Yes [provider]  omeprazole (PRILOSEC OTC) 20 MG  tablet Take 20 mg by mouth as needed.   Yes [provider]  potassium chloride SA (KLOR-CON M) 20 MEQ tablet Take by mouth. 07/11/22 07/11/23 Yes [provider]  predniSONE (STERAPRED UNI-PAK 21 TAB) 10 MG (21) TBPK tablet Take 6 tablets on day 1, 5 tablets day 2, 4 tablets day 3, 3 tablets day 4, 2 tablets day 5, 1 tablet day 6 10/27/22  Yes Becky Augusta, NP  oxyCODONE (OXY IR/ROXICODONE) 5 MG immediate release tablet Take 1 tablet (5 mg total) by mouth every 4 (four) hours as needed for severe pain. Patient not taking: Reported on 10/13/2021 03/17/21    Christeen Douglas, MD  fluticasone [facility]) 50 MCG/ACT nasal spray Place 2 sprays into both nostrils daily. 10/12/16 04/04/19  Lutricia Feil, PA-C    Family History Family History  Problem Relation Age of Onset   Heart failure Mother    Heart failure Father     Social History Social History   Tobacco Use   Smoking status: Never   Smokeless tobacco: Never  Vaping Use   Vaping status: Never Used  Substance Use Topics   Alcohol use: No   Drug use: No     Allergies   Keflex [cephalexin], Silicone, and Tape   Review of Systems Review of Systems  Constitutional:  Negative for fever.  HENT:  Negative for trouble swallowing.   Respiratory:  Negative for shortness of breath.   Skin:  Positive for color change.       Swelling to left cheek.     Physical Exam Triage Vital Signs ED Triage Vitals  Encounter Vitals Group     BP 10/27/22 0812 119/81     Systolic BP Percentile --      Diastolic BP Percentile --      Pulse Rate 10/27/22 0812 74     Resp 10/27/22 0812 16     Temp 10/27/22 0812 98.4 F (36.9 C)     Temp Source 10/27/22 0812 Oral     SpO2 10/27/22 0812 94 %     Weight 10/27/22 0811 (!) 350 lb (158.8 kg)     Height 10/27/22 0811 5\' 8"  (1.727 m)     Head Circumference --      Peak Flow --      Pain Score 10/27/22 0815 5     Pain Loc --      Pain Education --      Exclude from Growth Chart --    No data found.  Updated Vital Signs BP 119/81 (BP Location: Right Wrist)   Pulse 74   Temp 98.4 F (36.9 C) (Oral)   Resp 16   Ht 5\' 8"  (1.727 m)   Wt (!) 350 lb (158.8 kg)   LMP 01/06/2021   SpO2 94%   BMI 53.22 kg/m   Visual Acuity Right Eye Distance: 20/70 (uncorrected) Left Eye Distance: 20/50 (uncorrected) Bilateral Distance: 20/30 (uncorrected)  Right Eye Near:   Left Eye Near:    Bilateral Near:     Physical Exam Vitals and nursing note reviewed.  Constitutional:      Appearance: Normal appearance.  Eyes:     General: No scleral  icterus.       Left eye: No discharge.     Extraocular Movements: Extraocular movements intact.     Conjunctiva/sclera: Conjunctivae normal.     Pupils: Pupils are equal, round, and reactive to light.  Skin:    General: Skin is warm and dry.  Capillary Refill: Capillary refill takes less than 2 seconds.     Findings: Erythema present.  Neurological:     General: No focal deficit present.     Mental Status: She is alert and oriented to person, place, and time.      UC Treatments / Results  Labs (all labs ordered are listed, but only abnormal results are displayed) Labs Reviewed - No data to display  EKG   Radiology No results found.  Procedures Procedures (including critical care time)  Medications Ordered in UC Medications  dexamethasone (DECADRON) injection 10 mg (10 mg Intramuscular Given 10/27/22 0842)    Initial Impression / Assessment and Plan / UC Course  I have reviewed the triage vital signs and the nursing notes.  Pertinent labs & imaging results that were available during my care of the patient were reviewed by me and considered in my medical decision making (see chart for details).   A nontoxic-appearing 52 year old female presenting for evaluation of possible spider bite to her left cheek that is causing to have burning around her left orbit and left temple and some blurry vision in her left eye.  Patient seen image above, there is some mild erythema to the infraorbital region laterally with some mild erythema and edema to the lower eyelid.  Pupils are equal round reactive and red light reflex is normal in the left eye.  EOM is also intact.  Conjunctiva and sclera are unremarkable.  She is not having any facial droop or facial weakness.  Given that she is complaining of blurry vision I will have staff check a visual acuity.  Plan to give 10 mg of IM Decadron to help with the itching and burning sensation that she feels and then we will discharge her home on  Augmentin 875, prednisone, and antihistamines.  Patient's chart indicates that she has renal disorder, however after reviewing her records with [facility] there is no mention of renal disorder.  CMP from 2024 shows normal renal function.  Visual acuity shows OU 20/30, OD 20/70, OS 20/50.  I will discharge patient home on a prednisone taper for the next 6 days and also instruct her to take over-the-counter antihistamine such as Zyrtec, Claritin, or Allegra during the day and Benadryl at night as needed for itching.  If she develops any worsening swelling of her eye or worsening vision she needs to be evaluated by ophthalmology.  I will also discharge her home on Augmentin 875 mg - 125 mg twice daily for 5 days to prevent infection.  Final Clinical Impressions(s) / UC Diagnoses   Final diagnoses:  Spider bite allergy, current reaction, accidental or unintentional, initial encounter     Discharge Instructions      Take the Augmentin 875 twice daily with food for 5 days to prevent infection from the spider bite.  Starting tomorrow morning take the prednisone according to the package instructions.  You will take it each morning at breakfast for a period of 6 days.  For the next 6 days you may use over-the-counter Claritin, Allegra, or Zyrtec during the day as needed for itching and use Benadryl 50 mg at bedtime as needed for itching.  You may also continue to apply ice to your left eye to assist with swelling.  If you have any worsening swelling to your left eye, develop drainage from left eye, changes in vision, or fever you need to be evaluated in the emergency department.     ED Prescriptions  Medication Sig Dispense Auth. Provider   amoxicillin-clavulanate (AUGMENTIN) 875-125 MG tablet Take 1 tablet by mouth every 12 (twelve) hours for 5 days. 10 tablet Becky Augusta, NP   predniSONE (STERAPRED UNI-PAK 21 TAB) 10 MG (21) TBPK tablet Take 6 tablets on day 1, 5 tablets day 2, 4  tablets day 3, 3 tablets day 4, 2 tablets day 5, 1 tablet day 6 21 tablet Becky Augusta, NP      PDMP not reviewed this encounter.   Becky Augusta, NP 10/27/22 [phone]

## 2022-10-27 NOTE — Discharge Instructions (Signed)
Take the Augmentin 875 twice daily with food for 5 days to prevent infection from the spider bite.  Starting tomorrow morning take the prednisone according to the package instructions.  You will take it each morning at breakfast for a period of 6 days.  For the next 6 days you may use over-the-counter Claritin, Allegra, or Zyrtec during the day as needed for itching and use Benadryl 50 mg at bedtime as needed for itching.  You may also continue to apply ice to your left eye to assist with swelling.  If you have any worsening swelling to your left eye, develop drainage from left eye, changes in vision, or fever you need to be evaluated in the emergency department.

## 2022-10-27 NOTE — ED Triage Notes (Signed)
Pt c/o spider bite in L eye that occurred about 1 hr ago, c/o burning & itchiness. States pain radiating to face & jaw.

## 2023-10-10 IMAGING — MG MM DIGITAL SCREENING BILAT W/ TOMO AND CAD
8 of 14 series · 8 of 40 positions shown · non-contrast
Comparison: Previous exam(s).

ACR Breast Density Category a: The breast tissue is almost entirely
fatty.

CLINICAL DATA: Screening.

EXAM:
DIGITAL SCREENING BILATERAL MAMMOGRAM WITH TOMOSYNTHESIS AND CAD
TECHNIQUE: Bilateral screening digital craniocaudal and mediolateral oblique
mammograms were obtained. Bilateral screening digital breast
tomosynthesis was performed. The images were evaluated with
computer-aided detection.

[L MLO synth-2D (1 of 2)]
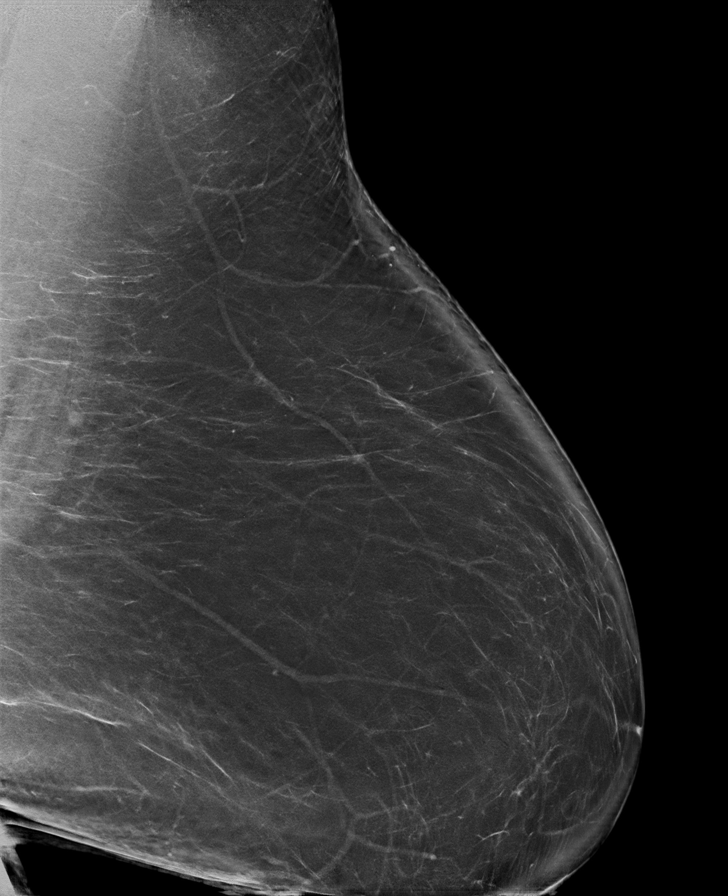

[R CC synth-2D]
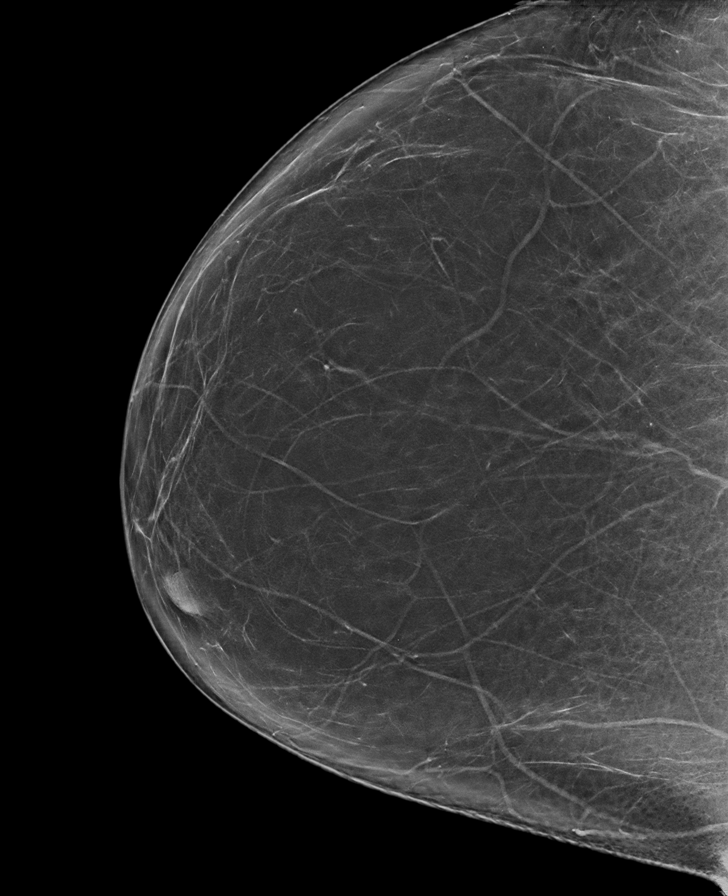

[R XCCL synth-2D]
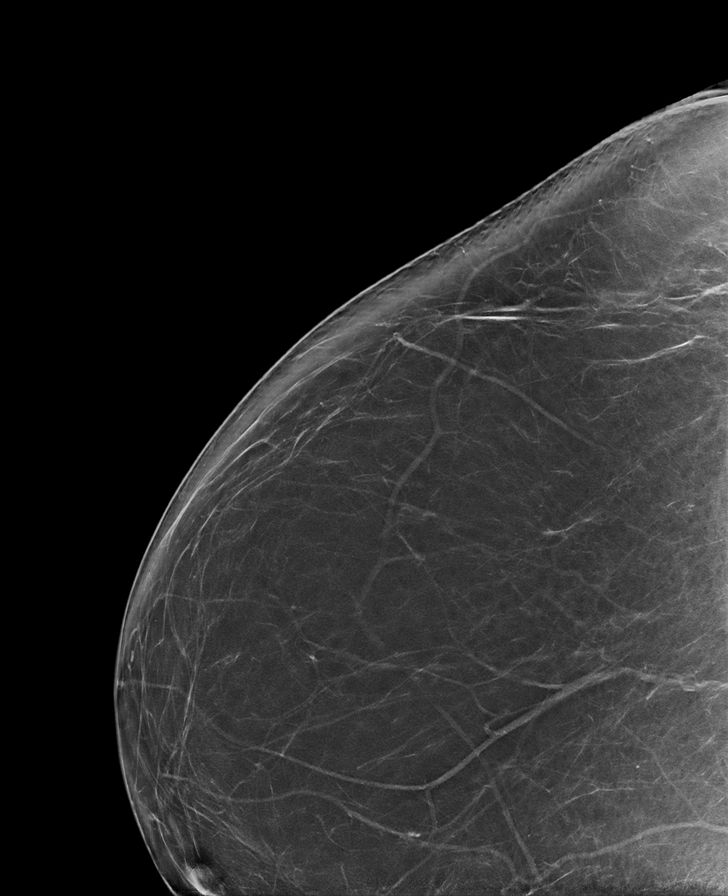

[L CC synth-2D]
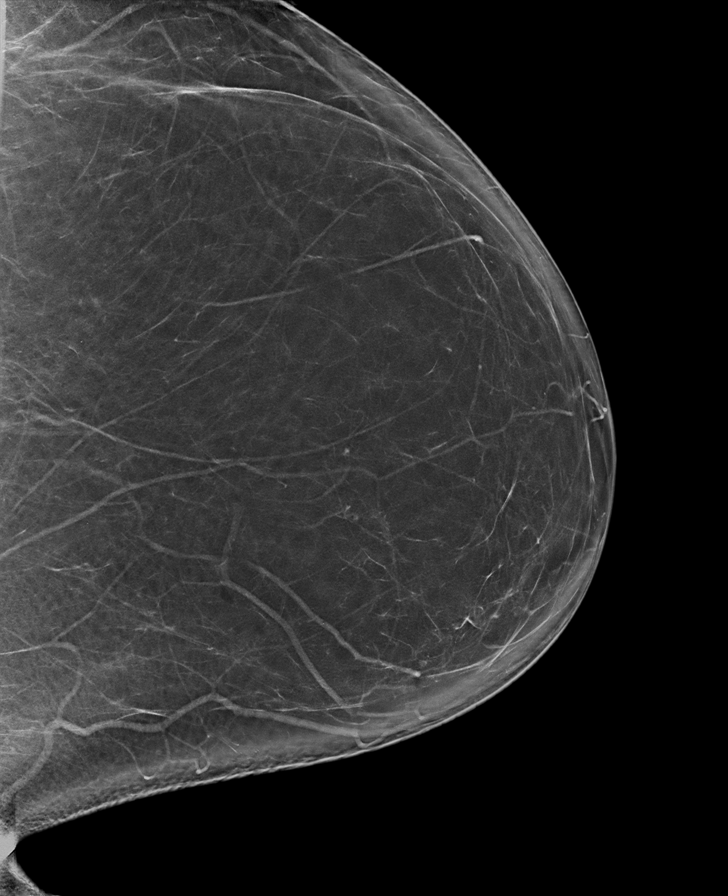

[L XCCL synth-2D]
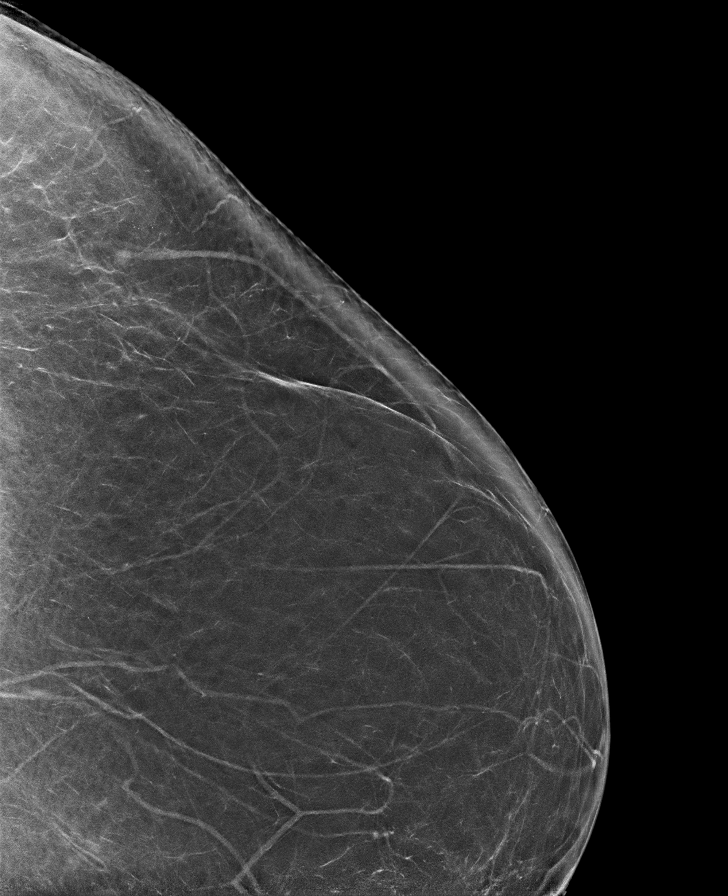

[L MLO synth-2D (2 of 2)]
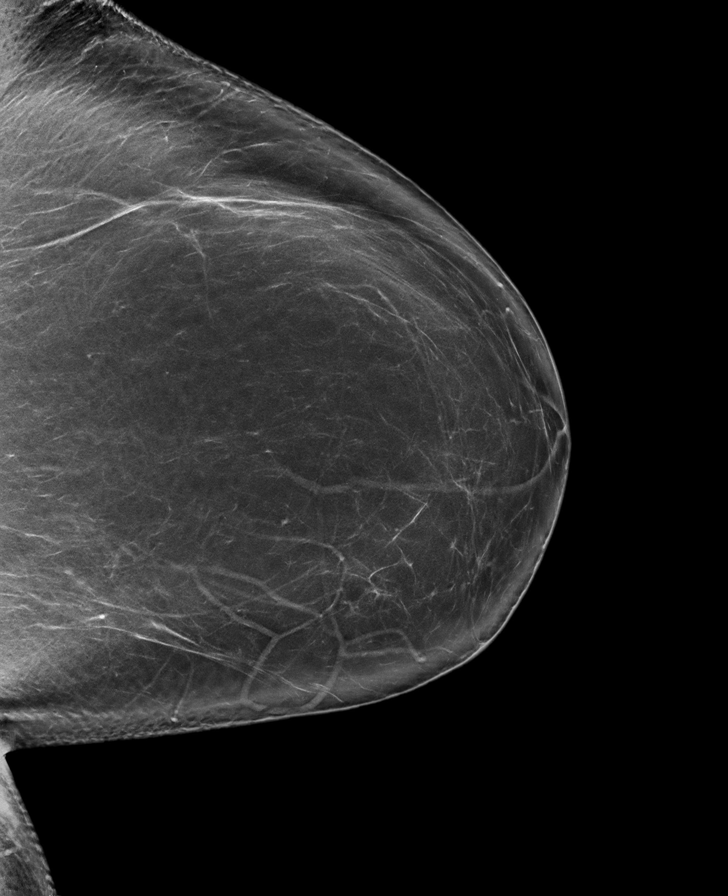

[R MLO synth-2D]
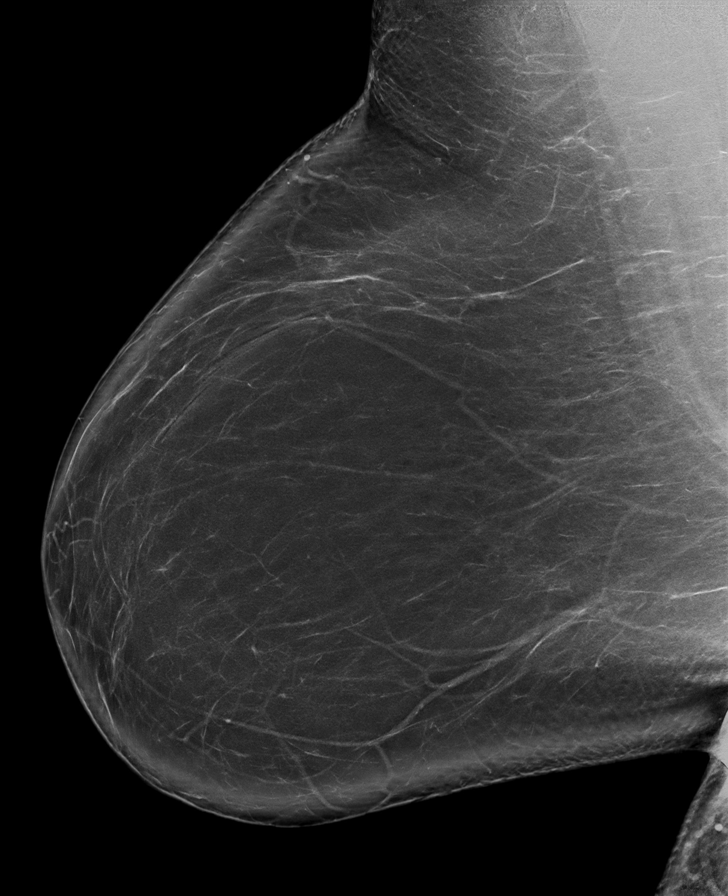

[L MLO tomo · tomo slice 49/97.0]
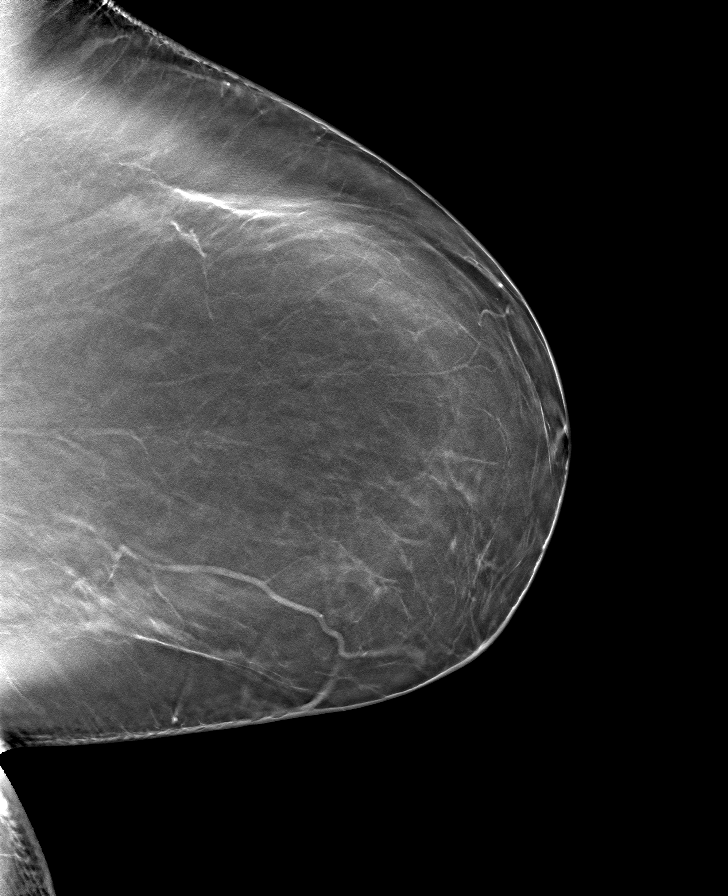

[8 of 40 positions shown; findings below may reference images not displayed]

FINDINGS: There are no findings suspicious for malignancy.
IMPRESSION: No mammographic evidence of malignancy. A result letter of this
screening mammogram will be mailed directly to the patient.

RECOMMENDATION:
Screening mammogram in one year. (Code:0E-3-N98)

BI-RADS CATEGORY  1: Negative.

## 2023-11-02 ENCOUNTER — Other Ambulatory Visit: Payer: Self-pay | Admitting: Internal Medicine

## 2023-11-02 DIAGNOSIS — Z1231 Encounter for screening mammogram for malignant neoplasm of breast: Secondary | ICD-10-CM

## 2023-11-03 ENCOUNTER — Other Ambulatory Visit: Payer: Self-pay | Admitting: Internal Medicine

## 2023-11-03 DIAGNOSIS — G8929 Other chronic pain: Secondary | ICD-10-CM

## 2023-11-08 ENCOUNTER — Ambulatory Visit
Admission: RE | Admit: 2023-11-08 | Discharge: 2023-11-08 | Disposition: A | Discharge status: Home / Self Care | Source: Ambulatory Visit | Attending: Internal Medicine | Admitting: Internal Medicine

## 2023-11-08 DIAGNOSIS — M5441 Lumbago with sciatica, right side: Secondary | ICD-10-CM | POA: Diagnosis present

## 2023-11-08 DIAGNOSIS — G8929 Other chronic pain: Secondary | ICD-10-CM | POA: Insufficient documentation

## 2023-11-30 ENCOUNTER — Ambulatory Visit
Admission: EM | Admit: 2023-11-30 | Discharge: 2023-11-30 | Disposition: A | Discharge status: Home / Self Care | Attending: Emergency Medicine | Admitting: Emergency Medicine

## 2023-11-30 DIAGNOSIS — R350 Frequency of micturition: Secondary | ICD-10-CM | POA: Insufficient documentation

## 2023-11-30 DIAGNOSIS — M545 Low back pain, unspecified: Secondary | ICD-10-CM | POA: Diagnosis not present

## 2023-11-30 DIAGNOSIS — M62830 Muscle spasm of back: Secondary | ICD-10-CM | POA: Diagnosis present

## 2023-11-30 DIAGNOSIS — R102 Pelvic and perineal pain: Secondary | ICD-10-CM | POA: Insufficient documentation

## 2023-11-30 DIAGNOSIS — Z6841 Body Mass Index (BMI) 40.0 and over, adult: Secondary | ICD-10-CM | POA: Insufficient documentation

## 2023-11-30 LAB — URINALYSIS, ROUTINE W REFLEX MICROSCOPIC
Glucose, UA: NEGATIVE mg/dL
Hgb urine dipstick: NEGATIVE
Leukocytes,Ua: NEGATIVE
Nitrite: NEGATIVE
Protein, ur: 30 mg/dL — AB
Specific Gravity, Urine: >1.03 — ABNORMAL HIGH (ref 1.005–1.030)
pH: 6 (ref 5.0–8.0)

## 2023-11-30 LAB — URINALYSIS, MICROSCOPIC (REFLEX): RBC / HPF: NONE SEEN RBC/hpf (ref 0–5)

## 2023-11-30 MED ORDER — METHOCARBAMOL 500 MG PO TABS: 500.0000 mg | ORAL_TABLET | Freq: Two times a day (BID) | ORAL | 0 refills | Status: AC | PRN

## 2023-11-30 NOTE — ED Provider Notes (Signed)
 [facility]    CSN: [id number] Arrival date & time: 11/30/23  0813      History   Chief Complaint Chief Complaint  Patient presents with   Urinary Tract Infection    HPI Teresa Cameron is a 53 y.o. female.   53 year old female, Teresa Cameron, presents to urgent care for evaluation of possible UTI for the last 6 days. Pt reports urinary frequency, odor, pelvic pain, no discharge, no concern for STI, no new partner. Last UTI was 2 years prior. Pt also c/o low back pain, had back of MRI recently done by PCP office for back pain/sciatica.  Patient denies any saddle numbness, loss of bowel or bladder, or loss of function, no fall or trauma.  PMH: Anemia, anxiety, asthma, IBS  GERD kidney stones    The history is provided by the patient. No language interpreter was used.    Past Medical History:  Diagnosis Date   Anemia    Anxiety    h/o   Asthma    well controlled-no inhalers   Endometrial cancer (HCC)    GERD (gastroesophageal reflux disease)    History of kidney stones    Pneumonia    h/o   Renal disorder     Patient Active Problem List   Diagnosis Date Noted   Urinary frequency 11/30/2023   Lumbar back pain 11/30/2023   Muscle spasm of back 11/30/2023   Morbid obesity (HCC) 11/30/2023   Iron  deficiency anemia due to chronic blood loss 03/08/2021   Endometrial cancer (HCC) 03/03/2021   Anemia due to chronic blood loss 03/03/2021   GERD (gastroesophageal reflux disease) 03/03/2021    Past Surgical History:  Procedure Laterality Date   CHOLECYSTECTOMY     COLONOSCOPY     COLONOSCOPY WITH PROPOFOL  N/A 12/31/2021   Procedure: COLONOSCOPY WITH PROPOFOL ;  Surgeon: Maryruth Ole DASEN, MD;  Location: [facility];  Service: Endoscopy;  Laterality: N/A;   TOTAL LAPAROSCOPIC HYSTERECTOMY WITH BILATERAL SALPINGO OOPHORECTOMY Bilateral 03/17/2021   Procedure: TOTAL LAPAROSCOPIC HYSTERECTOMY WITH BILATERAL SALPINGO OOPHORECTOMY;  Surgeon:  Mancil Barter, MD;  Location: [facility] OR[facility];  Service: Gynecology;  Laterality: Bilateral;    OB History   No obstetric history on file.      Home Medications    Prior to Admission medications   Medication Sig Start Date End Date Taking? Authorizing Provider  albuterol  (VENTOLIN  HFA) 108 (90 Base) MCG/ACT inhaler Inhale 1-2 puffs into the lungs every 6 (six) hours as needed for wheezing or shortness of breath. 11/13/20  Yes Gwenn Kent, MD  dicyclomine (BENTYL) 10 MG capsule Take 10 mg by mouth at bedtime.   Yes [provider]  losartan-hydrochlorothiazide (HYZAAR) 50-12.5 MG tablet Take 1 tablet by mouth daily. 08/22/21  Yes [provider]  meloxicam (MOBIC) 15 MG tablet Take 15 mg by mouth. 11/01/23  Yes [provider]  methocarbamol  (ROBAXIN ) 500 MG tablet Take 1 tablet (500 mg total) by mouth every 12 (twelve) hours as needed for muscle spasms. 11/30/23  Yes Zykeriah Mathia, NP  Multiple Vitamins-Minerals (MULTIVITAMIN WITH MINERALS) tablet Take 1 tablet by mouth daily. [place] with Iron    Yes [provider]  docusate sodium  (COLACE) 100 MG capsule Take 1 capsule (100 mg total) by mouth 2 (two) times daily. To keep stools soft 03/17/21   Verdon Keen, MD  ferrous sulfate 325 (65 FE) MG EC tablet Take 325 mg by mouth 2 (two) times daily. 02/24/21 10/27/22  [provider]  gabapentin  (  NEURONTIN ) 800 MG tablet Take 1 tablet (800 mg total) by mouth at bedtime for 14 days. Take nightly for 3 days, then up to 14 days as needed 03/17/21 10/27/22  Verdon Keen, MD  omeprazole (PRILOSEC OTC) 20 MG tablet Take 20 mg by mouth as needed.    [provider]  oxyCODONE  (OXY IR/ROXICODONE ) 5 MG immediate release tablet Take 1 tablet (5 mg total) by mouth every 4 (four) hours as needed for severe pain. Patient not taking: Reported on 10/13/2021 03/17/21   Verdon Keen, MD  potassium chloride SA (KLOR-CON M) 20 MEQ tablet Take by mouth.  07/11/22 07/11/23  [provider]  predniSONE  (STERAPRED UNI-PAK 21 TAB) 10 MG (21) TBPK tablet Take 6 tablets on day 1, 5 tablets day 2, 4 tablets day 3, 3 tablets day 4, 2 tablets day 5, 1 tablet day 6 10/27/22   Bernardino Ditch, NP  fluticasone  (FLONASE ) 50 MCG/ACT nasal spray Place 2 sprays into both nostrils daily. 10/12/16 04/04/19  Lacinda Elsie SQUIBB, PA-C    Family History Family History  Problem Relation Age of Onset   Heart failure Mother    Heart failure Father     Social History Social History   Tobacco Use   Smoking status: Never   Smokeless tobacco: Never  Vaping Use   Vaping status: Never Used  Substance Use Topics   Alcohol use: No   Drug use: No     Allergies   Keflex [cephalexin], Silicone, and Tape   Review of Systems Review of Systems  Constitutional:  Negative for fever.  Genitourinary:  Positive for frequency and pelvic pain.       Urinary odor  Musculoskeletal:  Positive for back pain and myalgias.  All other systems reviewed and are negative.    Physical Exam Triage Vital Signs ED Triage Vitals  Encounter Vitals Group     BP      Girls Systolic BP Percentile      Girls Diastolic BP Percentile      Boys Systolic BP Percentile      Boys Diastolic BP Percentile      Pulse      Resp      Temp      Temp src      SpO2      Weight      Height      Head Circumference      Peak Flow      Pain Score      Pain Loc      Pain Education      Exclude from Growth Chart    No data found.  Updated Vital Signs BP 92/62 (BP Location: Left Arm)   Pulse 81   Temp 97.7 F (36.5 C) (Oral)   Resp 16   Ht 5' 8 (1.727 m)   Wt (!) 350 lb (158.8 kg)   LMP 01/06/2021   SpO2 96%   BMI 53.22 kg/m   Visual Acuity Right Eye Distance:   Left Eye Distance:   Bilateral Distance:    Right Eye Near:   Left Eye Near:    Bilateral Near:     Physical Exam Vitals and nursing note reviewed.  Constitutional:      General: She is not in acute  distress.    Appearance: She is well-developed and well-groomed. She is morbidly obese.  HENT:     Head: Normocephalic and atraumatic.  Eyes:     Conjunctiva/sclera: Conjunctivae normal.  Cardiovascular:  Rate and Rhythm: Normal rate.     Heart sounds: No murmur heard. Pulmonary:     Effort: Pulmonary effort is normal. No respiratory distress.  Abdominal:     Palpations: Abdomen is soft.     Tenderness: There is no abdominal tenderness.  Musculoskeletal:        General: No swelling.     Cervical back: Neck supple.     Lumbar back: Spasms and tenderness present. Negative right straight leg raise test and negative left straight leg raise test.  Skin:    General: Skin is warm and dry.     Capillary Refill: Capillary refill takes less than 2 seconds.  Neurological:     General: No focal deficit present.     Mental Status: She is alert and oriented to person, place, and time.     GCS: GCS eye subscore is 4. GCS verbal subscore is 5. GCS motor subscore is 6.     Gait: Gait is intact.  Psychiatric:        Attention and Perception: Attention normal.        Mood and Affect: Mood normal.        Speech: Speech normal.        Behavior: Behavior normal. Behavior is cooperative.      UC Treatments / Results  Labs (all labs ordered are listed, but only abnormal results are displayed) Labs Reviewed  URINALYSIS, ROUTINE W REFLEX MICROSCOPIC - Abnormal; Notable for the following components:      Result Value   Specific Gravity, Urine >1.030 (*)    Bilirubin Urine SMALL (*)    Ketones, ur TRACE (*)    Protein, ur 30 (*)    All other components within normal limits  URINALYSIS, MICROSCOPIC (REFLEX) - Abnormal; Notable for the following components:   Bacteria, UA FEW (*)    All other components within normal limits    EKG   Radiology No results found.  Procedures Procedures (including critical care time)  Medications Ordered in UC Medications - No data to display  Initial  Impression / Assessment and Plan / UC Course  I have reviewed the triage vital signs and the nursing notes.  Pertinent labs & imaging results that were available during my care of the patient were reviewed by me and considered in my medical decision making (see chart for details).  Clinical Course as of 11/30/23 1905  Thu Nov 30, 2023  0921 UA shows contaminated sample with 21-50 squamous, trace ketones, protein 30, SG >1.030, few bacteria, no RBC. No UTI. [JD]    Clinical Course User Index [JD] Karena Kinker, Rilla, NP   Discussed exam findings and plan of care with patient, Robaxin  prescription for muscle spasm, strict go to ER precautions given.   Patient verbalized understanding to this provider.  Ddx: Urinary frequency, lumbar back pain, muscle spasm of back, morbid obesity Final Clinical Impressions(s) / UC Diagnoses   Final diagnoses:  Urinary frequency  Lumbar back pain  Muscle spasm of back  Morbid obesity (HCC)     Discharge Instructions      Drink plenty of water, avoid caffeine Your urine was negative UTI and for blood Take muscle relaxer as prescribed Take home meds as directed.  May use heat or ice to back for comfort 20 min 3 x daily.  May use lidocaine  patch or biofreeze for pain.  Please follow up with PCP, may need to referral to physical therapy for further back pain management.  GO immediately  to nearest ER or call 9-1-1 for loss of bowel and bladder,loss of function, saddle numbness, etc.  Pt advised otc tylenol  as label directed for pain Avoid lifting,turning,bending as this will aggravate your back     ED Prescriptions     Medication Sig Dispense Auth. Provider   methocarbamol  (ROBAXIN ) 500 MG tablet Take 1 tablet (500 mg total) by mouth every 12 (twelve) hours as needed for muscle spasms. 20 tablet Rasul Decola, Rilla, NP      PDMP not reviewed this encounter.   Aminta Rilla, NP 11/30/23 1905

## 2023-11-30 NOTE — ED Triage Notes (Signed)
 Pt c/o possible UTI x6days  Pt states that she has had back pain, urinary frequency, urinary odor and pelvic pain   Pt states that OTC medication does not help.

## 2023-11-30 NOTE — Discharge Instructions (Addendum)
 Drink plenty of water, avoid caffeine Your urine was negative UTI and for blood Take muscle relaxer as prescribed Take home meds as directed.  May use heat or ice to back for comfort 20 min 3 x daily.  May use lidocaine  patch or biofreeze for pain.  Please follow up with PCP, may need to referral to physical therapy for further back pain management.  GO immediately to nearest ER or call 9-1-1 for loss of bowel and bladder,loss of function, saddle numbness, etc.  Pt advised otc tylenol  as label directed for pain Avoid lifting,turning,bending as this will aggravate your back

## 2024-02-21 ENCOUNTER — Other Ambulatory Visit: Payer: Self-pay | Admitting: Family Medicine

## 2024-02-21 DIAGNOSIS — M5416 Radiculopathy, lumbar region: Secondary | ICD-10-CM
